# Patient Record
Sex: Male | Born: 1945 | Race: White | Hispanic: No | Marital: Married | Smoking: Former smoker
Health system: Southern US, Community
[De-identification: ages and names within clinical notes are randomized; demographics above are authoritative.]

## PROBLEM LIST (undated history)

## (undated) DIAGNOSIS — H3530 Unspecified macular degeneration: Secondary | ICD-10-CM

## (undated) DIAGNOSIS — M1990 Unspecified osteoarthritis, unspecified site: Secondary | ICD-10-CM

## (undated) DIAGNOSIS — D369 Benign neoplasm, unspecified site: Secondary | ICD-10-CM

## (undated) DIAGNOSIS — K635 Polyp of colon: Secondary | ICD-10-CM

## (undated) DIAGNOSIS — N200 Calculus of kidney: Secondary | ICD-10-CM

## (undated) DIAGNOSIS — K219 Gastro-esophageal reflux disease without esophagitis: Secondary | ICD-10-CM

## (undated) DIAGNOSIS — R5383 Other fatigue: Secondary | ICD-10-CM

## (undated) DIAGNOSIS — Z87442 Personal history of urinary calculi: Secondary | ICD-10-CM

## (undated) DIAGNOSIS — M51369 Other intervertebral disc degeneration, lumbar region without mention of lumbar back pain or lower extremity pain: Secondary | ICD-10-CM

## (undated) DIAGNOSIS — E785 Hyperlipidemia, unspecified: Secondary | ICD-10-CM

## (undated) DIAGNOSIS — I10 Essential (primary) hypertension: Secondary | ICD-10-CM

## (undated) DIAGNOSIS — T884XXA Failed or difficult intubation, initial encounter: Secondary | ICD-10-CM

## (undated) DIAGNOSIS — J309 Allergic rhinitis, unspecified: Secondary | ICD-10-CM

## (undated) DIAGNOSIS — H269 Unspecified cataract: Secondary | ICD-10-CM

## (undated) DIAGNOSIS — F419 Anxiety disorder, unspecified: Secondary | ICD-10-CM

## (undated) DIAGNOSIS — L4050 Arthropathic psoriasis, unspecified: Secondary | ICD-10-CM

## (undated) DIAGNOSIS — R7301 Impaired fasting glucose: Secondary | ICD-10-CM

## (undated) DIAGNOSIS — Z86010 Personal history of colonic polyps: Secondary | ICD-10-CM

## (undated) DIAGNOSIS — J302 Other seasonal allergic rhinitis: Secondary | ICD-10-CM

## (undated) DIAGNOSIS — M259 Joint disorder, unspecified: Secondary | ICD-10-CM

## (undated) DIAGNOSIS — T7840XA Allergy, unspecified, initial encounter: Secondary | ICD-10-CM

## (undated) DIAGNOSIS — J189 Pneumonia, unspecified organism: Secondary | ICD-10-CM

## (undated) DIAGNOSIS — R319 Hematuria, unspecified: Secondary | ICD-10-CM

## (undated) DIAGNOSIS — Z87440 Personal history of urinary (tract) infections: Secondary | ICD-10-CM

## (undated) DIAGNOSIS — R011 Cardiac murmur, unspecified: Secondary | ICD-10-CM

## (undated) HISTORY — DX: Benign neoplasm, unspecified site: D36.9

## (undated) HISTORY — DX: Impaired fasting glucose: R73.01

## (undated) HISTORY — DX: Unspecified cataract: H26.9

## (undated) HISTORY — DX: Joint disorder, unspecified: M25.9

## (undated) HISTORY — DX: Other fatigue: R53.83

## (undated) HISTORY — DX: Gastro-esophageal reflux disease without esophagitis: K21.9

## (undated) HISTORY — DX: Essential (primary) hypertension: I10

## (undated) HISTORY — DX: Other intervertebral disc degeneration, lumbar region without mention of lumbar back pain or lower extremity pain: M51.369

## (undated) HISTORY — DX: Polyp of colon: K63.5

## (undated) HISTORY — DX: Calculus of kidney: N20.0

## (undated) HISTORY — DX: Hematuria, unspecified: R31.9

## (undated) HISTORY — DX: Personal history of colonic polyps: Z86.010

## (undated) HISTORY — DX: Unspecified macular degeneration: H35.30

## (undated) HISTORY — DX: Anxiety disorder, unspecified: F41.9

## (undated) HISTORY — DX: Personal history of urinary (tract) infections: Z87.440

## (undated) HISTORY — DX: Allergy, unspecified, initial encounter: T78.40XA

## (undated) HISTORY — PX: COLONOSCOPY: SHX174

## (undated) HISTORY — DX: Hyperlipidemia, unspecified: E78.5

## (undated) HISTORY — DX: Allergic rhinitis, unspecified: J30.9

## (undated) HISTORY — DX: Arthropathic psoriasis, unspecified: L40.50

## (undated) HISTORY — DX: Unspecified osteoarthritis, unspecified site: M19.90

---

## 1978-01-25 HISTORY — PX: APPENDECTOMY: SHX54

## 2004-04-06 ENCOUNTER — Ambulatory Visit (HOSPITAL_COMMUNITY): Admission: RE | Admit: 2004-04-06 | Discharge: 2004-04-06 | Payer: Self-pay | Admitting: *Deleted

## 2008-09-17 ENCOUNTER — Emergency Department (HOSPITAL_COMMUNITY): Admission: EM | Admit: 2008-09-17 | Discharge: 2008-09-17 | Payer: Self-pay | Admitting: Emergency Medicine

## 2009-12-31 ENCOUNTER — Inpatient Hospital Stay (HOSPITAL_COMMUNITY)
Admission: EM | Admit: 2009-12-31 | Discharge: 2010-01-03 | Payer: Self-pay | Source: Home / Self Care | Attending: Internal Medicine | Admitting: Internal Medicine

## 2010-01-08 ENCOUNTER — Encounter
Admission: RE | Admit: 2010-01-08 | Discharge: 2010-01-08 | Payer: Self-pay | Source: Home / Self Care | Attending: Internal Medicine | Admitting: Internal Medicine

## 2010-03-11 NOTE — Discharge Summary (Signed)
Steven Holloway, Steven Holloway                ACCOUNT NO.:  [id number]  MEDICAL RECORD NO.:  [id number]          PATIENT TYPE:  INP  LOCATION:  5150                         FACILITY:  [facility]  PHYSICIAN:  Larina Earthly, M.D.        DATE OF BIRTH:  [birth year]  DATE OF ADMISSION:  12/31/2009 DATE OF DISCHARGE:  01/03/2010                              DISCHARGE SUMMARY   DISCHARGE DIAGNOSES: 1. Partial small-bowel obstruction, resolved status post placement of     nasogastric tube without operative intervention resolved     radiologically and clinically. 2. Hypertension. 3. Abnormal PSA probably secondary to prostatitis now with normal PSA. 4. 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  DISCHARGE MEDICATIONS:  Losartan 100 mg half a tablets p.o. b.i.d., vitamin B complex over the counter, aspirin 81 mg each day, multivitamin each day, fish oil over the counter 2 tablets daily, cranberry supplement over the counter held while in the hospital.  FINAL LAB RESULTS:  White blood cell count 6.4, hemoglobin 13.1, hematocrit 37.9%, platelet count 149, ionized calcium 1.17, sodium 140, potassium 4.1, BUN 17, creatinine 1.04.  PT/INR 1.05.  PT 13.9 seconds. GFR greater than 60.  Calcium 9.4.  Total protein 6.3, albumin 3.4, AST 22, ALT 20, alkaline phosphatase 76, total bilirubin 1.2, lipase 42. PSA 2.98.  Urinalysis unremarkable for evidence of infection with urine culture revealing no growth.  C. difficile toxin negative.  RADIOLOGY ASSESSMENT:  CT scan of the abdomen, pelvis revealed small bowel obstructive pattern favored to be partial/ low grade obstruction. No discrete transition point is identified.  Adhesions are most likely consideration.  Small amount of ascites related to obstruction. Cholelithiasis, renal cyst, elevated left hemidiaphragm with left basilar atelectasis.  KUB on January 03, 2010, revealed elevated left hemidiaphragm probably due to  gastric distention, otherwise normal chest x-ray.  Cervical spine complete 4-view reveals loss of lordosis and degenerative disk disease.  No acute findings.  Bilateral carotid bifurcation, calcification.  KUB on January 03, 2010, revealed improving small bowel obstructive pattern, slight prominence of upper pelvic, small bowel loops persists.  HISTORY OF PRESENT ILLNESS:  [age]-year-old male seen in November for annual physical exam and prior to that had physical labs to include urinalysis that was positive for urinary tract infection in addition to an elevated PSA, was placed on Cipro.  He subsequently developed symptoms of abdominal discomfort, urinary symptoms, dysuria, fevers and chills.  Because of his GI symptoms correlated with the initiation of Cipro, Cipro was changed to Macrodantin.  He called in early December with increasing abdominal bloating, increasing constipation, gas.  KUB on December 30, 2009, was negative.  He was treated with MiraLax and increased fluids and Prilosec.  He came in to the emergency departmenton December 31, 2009, where radiological evaluation revealed partial small-bowel obstruction.  NG tube was placed.  He was treated with morphine, Zofran and he was subsequently admitted.  HOSPITAL COURSE:  The day after admission NG tube was placed.  Surgical consultation was obtained.  He was afebrile.  Vital signs were stable and there was no white  blood cell count.  Surgical consultation was obtained and given the fact that the patient seems to be resolving, NG tube was clamped and the patient was observed.  On January 02, 2010, NG tube was discontinued.  The patient was placed on clear fluids and diet was advanced as tolerated.  The patient remained hemodynamically stable with normal blood pressure.  Abnormal PSA in the past did resolve with a PSA of 2.98 and is also noted that the patient was getting benzodiazepine for cervical degenerative disk disease  and radiculopathy. Carotid calcifications were noted on plain films with the need for carotid ultrasound on an outpatient basis noted.  On January 03, 2010, the patient tolerated advance diet.  Vital signs were stable.  No further nausea or vomiting and the patient was deemed appropriate for discharge on January 03, 2010, for outpatient followup.     Larina Earthly, M.D.     RA/MEDQ  D:  02/03/2010  T:  02/03/2010  Job:  [id number]  Electronically Signed by Larina Earthly M.D. on 03/11/2010 09:56:45 PM

## 2010-04-07 LAB — URINE CULTURE: Culture  Setup Time: 201112071720

## 2010-04-07 LAB — DIFFERENTIAL
Basophils Absolute: 0.1 10*3/uL (ref 0.0–0.1)
Eosinophils Absolute: 0.6 10*3/uL (ref 0.0–0.7)
Monocytes Relative: 11 % (ref 3–12)
Neutro Abs: 3.5 10*3/uL (ref 1.7–7.7)
Neutrophils Relative %: 57 % (ref 43–77)

## 2010-04-07 LAB — COMPREHENSIVE METABOLIC PANEL
ALT: 26 U/L (ref 0–53)
AST: 22 U/L (ref 0–37)
AST: 25 U/L (ref 0–37)
Albumin: 3.9 g/dL (ref 3.5–5.2)
Alkaline Phosphatase: 76 U/L (ref 39–117)
Alkaline Phosphatase: 81 U/L (ref 39–117)
BUN: 12 mg/dL (ref 6–23)
Calcium: 8.9 mg/dL (ref 8.4–10.5)
Calcium: 9.4 mg/dL (ref 8.4–10.5)
Chloride: 107 mEq/L (ref 96–112)
Creatinine, Ser: 1.08 mg/dL (ref 0.4–1.5)
GFR calc Af Amer: >60 mL/min (ref >60)
GFR calc non Af Amer: >60 mL/min (ref >60)
GFR calc non Af Amer: >60 mL/min (ref >60)
Glucose, Bld: 92 mg/dL (ref 70–99)
Glucose, Bld: 97 mg/dL (ref 70–99)
Potassium: 3.8 mEq/L (ref 3.5–5.1)
Sodium: 146 mEq/L — ABNORMAL HIGH (ref 135–145)

## 2010-04-07 LAB — POCT I-STAT, CHEM 8
BUN: 15 mg/dL (ref 6–23)
Calcium, Ion: 1.17 mmol/L (ref 1.12–1.32)
Hemoglobin: 14.3 g/dL (ref 13.0–17.0)
Potassium: 4 mEq/L (ref 3.5–5.1)

## 2010-04-07 LAB — CBC
HCT: 37.9 % — ABNORMAL LOW (ref 39.0–52.0)
HCT: 39.1 % (ref 39.0–52.0)
HCT: 42.1 % (ref 39.0–52.0)
Hemoglobin: 13 g/dL (ref 13.0–17.0)
Hemoglobin: 13.1 g/dL (ref 13.0–17.0)
Hemoglobin: 14.4 g/dL (ref 13.0–17.0)
MCV: 87.1 fL (ref 78.0–100.0)
MCV: 88.4 fL (ref 78.0–100.0)
Platelets: 139 10*3/uL — ABNORMAL LOW (ref 150–400)
Platelets: 176 10*3/uL (ref 150–400)
RBC: 4.35 MIL/uL (ref 4.22–5.81)
RBC: 4.76 MIL/uL (ref 4.22–5.81)
RDW: 12.6 % (ref 11.5–15.5)
WBC: 6.2 10*3/uL (ref 4.0–10.5)
WBC: 6.4 10*3/uL (ref 4.0–10.5)
WBC: 7 10*3/uL (ref 4.0–10.5)

## 2010-04-07 LAB — BASIC METABOLIC PANEL
CO2: 23 mEq/L (ref 19–32)
GFR calc Af Amer: >60 mL/min (ref >60)
GFR calc non Af Amer: >60 mL/min (ref >60)
Glucose, Bld: 77 mg/dL (ref 70–99)
Potassium: 4.1 mEq/L (ref 3.5–5.1)

## 2010-04-07 LAB — PROTIME-INR
INR: 1.05 (ref 0.00–1.49)
Prothrombin Time: 13.9 seconds (ref 11.6–15.2)

## 2010-04-07 LAB — LIPASE, BLOOD: Lipase: 42 U/L (ref 11–59)

## 2010-04-07 LAB — URINALYSIS, ROUTINE W REFLEX MICROSCOPIC
Bilirubin Urine: NEGATIVE
Ketones, ur: 15 mg/dL — AB
Leukocytes, UA: NEGATIVE
Urobilinogen, UA: 0.2 mg/dL (ref 0.0–1.0)

## 2010-04-07 LAB — LACTIC ACID, PLASMA: Lactic Acid, Venous: 0.9 mmol/L (ref 0.5–2.2)

## 2010-04-07 LAB — CLOSTRIDIUM DIFFICILE BY PCR: Toxigenic C. Difficile by PCR: NEGATIVE

## 2010-04-07 LAB — URINE MICROSCOPIC-ADD ON

## 2013-03-20 ENCOUNTER — Encounter: Payer: Self-pay | Admitting: Internal Medicine

## 2013-05-04 ENCOUNTER — Ambulatory Visit (AMBULATORY_SURGERY_CENTER): Payer: Medicare Other | Admitting: *Deleted

## 2013-05-04 ENCOUNTER — Telehealth: Payer: Self-pay | Admitting: *Deleted

## 2013-05-04 VITALS — Ht 67.5 in | Wt 219.4 lb

## 2013-05-04 DIAGNOSIS — Z1211 Encounter for screening for malignant neoplasm of colon: Secondary | ICD-10-CM

## 2013-05-04 MED ORDER — NA SULFATE-K SULFATE-MG SULF 17.5-3.13-1.6 GM/177ML PO SOLN: 1.0000 | Freq: Once | ORAL | Status: DC

## 2013-05-04 NOTE — Progress Notes (Signed)
No egg or soy allergy. No anesthesia problems.  

## 2013-05-04 NOTE — Telephone Encounter (Signed)
Pt had pre-visit 05/04/13 for colonoscopy scheduled on 05/18/13 at 11am, pt has previous colonoscopy in 2006 at [facility] by Dr. Knox Saliva, I cannot see report or find the results of this procedure, pt states he did not find anything, no polyps, pt is not currently having any bowel issues, pt would not be due for screening until 2016, is it ok to go forward with scheduled colonoscopy or does pt need to wait until next year? pls adv-adm

## 2013-05-07 ENCOUNTER — Encounter: Payer: Self-pay | Admitting: Internal Medicine

## 2013-05-07 NOTE — Telephone Encounter (Signed)
Dr. Carlean Purl, Pamala Hurry checked this pt's last procedure for me- he didn't have any polyps with his last procedure in 2006 and is recommended to have his next one in 86 years  Steven Holloway

## 2013-05-07 NOTE — Telephone Encounter (Signed)
Please check Cori for colonoscopy

## 2013-05-07 NOTE — Telephone Encounter (Signed)
Report (colonoscopy 04/06/2004) has been put on Dr Celesta Aver desk for review.

## 2013-05-07 NOTE — Telephone Encounter (Signed)
It is in [place] , dated 04/06/2004.  We can show you the report tomorrow in [place], unable to print , printer broke.  Said repeat in 10 years, no polyps.

## 2013-05-10 ENCOUNTER — Encounter: Payer: Self-pay | Admitting: Internal Medicine

## 2013-05-10 NOTE — Progress Notes (Signed)
Patient ID: Steven Holloway, male   DOB: [birth year], 68 y.o.   MRN: [id number] Faxed copy of 04/06/2004 colonoscopy report to Dr. Dagmar Hait per Dr. Carlean Purl.

## 2013-05-10 NOTE — Telephone Encounter (Signed)
As long as we are sure no other reasons like heme + stool or anemia then can wait until 03/2014. Let me know

## 2013-05-10 NOTE — Telephone Encounter (Signed)
Above noted and pt's wife notified.  Recall put into computer and instructions made for pt to use Suprep since he has that already  Spoke with pt's wife and she checked expiration on pt's box- expires 12-2014

## 2013-05-18 ENCOUNTER — Encounter: Payer: Self-pay | Admitting: Internal Medicine

## 2014-03-21 ENCOUNTER — Encounter: Payer: Self-pay | Admitting: Internal Medicine

## 2014-05-14 ENCOUNTER — Ambulatory Visit (AMBULATORY_SURGERY_CENTER): Payer: Self-pay | Admitting: *Deleted

## 2014-05-14 VITALS — Ht 68.0 in | Wt 225.2 lb

## 2014-05-14 DIAGNOSIS — Z1211 Encounter for screening for malignant neoplasm of colon: Secondary | ICD-10-CM

## 2014-05-14 NOTE — Progress Notes (Signed)
No egg or soy allergy  No anesthesia or intubation problems per pt  No diet medications taken  Registered in [place] instructions given- pt had Suprep from last year.  See telephone encounter from 05-09-13

## 2014-05-27 DIAGNOSIS — Z860101 Personal history of adenomatous and serrated colon polyps: Secondary | ICD-10-CM | POA: Insufficient documentation

## 2014-05-27 DIAGNOSIS — Z86010 Personal history of colonic polyps: Secondary | ICD-10-CM

## 2014-05-27 HISTORY — DX: Personal history of colonic polyps: Z86.010

## 2014-05-27 HISTORY — DX: Personal history of adenomatous and serrated colon polyps: Z86.0101

## 2014-05-28 ENCOUNTER — Encounter: Payer: Self-pay | Admitting: Internal Medicine

## 2014-05-28 ENCOUNTER — Ambulatory Visit (AMBULATORY_SURGERY_CENTER): Payer: Medicare Other | Admitting: Internal Medicine

## 2014-05-28 VITALS — BP 105/60 | HR 52 | Temp 95.9°F | Resp 17 | Ht 68.0 in | Wt 225.0 lb

## 2014-05-28 DIAGNOSIS — D120 Benign neoplasm of cecum: Secondary | ICD-10-CM | POA: Diagnosis not present

## 2014-05-28 DIAGNOSIS — Z1211 Encounter for screening for malignant neoplasm of colon: Secondary | ICD-10-CM | POA: Diagnosis not present

## 2014-05-28 MED ORDER — SODIUM CHLORIDE 0.9 % IV SOLN: 500.0000 mL | INTRAVENOUS | Status: DC

## 2014-05-28 NOTE — Op Note (Signed)
[facility]  [address] [place], [address]   COLONOSCOPY PROCEDURE REPORT  PATIENT: Steven Holloway, Steven Holloway  MR#: [id number] BIRTHDATE: Mar 28, 1945 , 7  yrs. old GENDER: male ENDOSCOPIST: Gatha Mayer, MD, [facility] PROCEDURE DATE:  05/28/2014 PROCEDURE:   Colonoscopy, screening, Colonoscopy with biopsy, and Colonoscopy with snare polypectomy First Screening Colonoscopy - Avg.  risk and is 50 yrs.  old or older - No.  Prior Negative Screening - Now for repeat screening. 10 or more years since last screening  History of Adenoma - Now for follow-up colonoscopy & has been > or = to 3 yrs.  N/A ASA CLASS:   Class II INDICATIONS:Screening for colonic neoplasia and Colorectal Neoplasm Risk Assessment for this procedure is average risk. MEDICATIONS: Propofol 250 mg IV and Monitored anesthesia care  DESCRIPTION OF PROCEDURE:   After the risks benefits and alternatives of the procedure were thoroughly explained, informed consent was obtained.  The digital rectal exam revealed no abnormalities of the rectum, revealed no prostatic nodules, and revealed the prostate was not enlarged.   The LB [id number] endoscope was introduced through the anus and advanced to the cecum, which was identified by both the appendix and ileocecal valve. No adverse events experienced.   The quality of the prep was excellent.  (MiraLax was used)  The instrument was then slowly withdrawn as the colon was fully examined.      COLON FINDINGS: Two sessile polyps ranging from 2 to 78mm in size were found at the cecum.  Polypectomies were performed with cold forceps (43mm) and with a cold snare (10 mm).  The resection was complete, the polyp tissue was completely retrieved and sent to histology.   Internal hemorrhoids were found.   The examination was otherwise normal.  Retroflexed views revealed internal hemorrhoids. The time to cecum = 2.4 Withdrawal time = 13.3   The scope was withdrawn and the  procedure completed. COMPLICATIONS: There were no immediate complications.  ENDOSCOPIC IMPRESSION: 1.   Two sessile polyps ranging from 2 to 21mm in size were found at the cecum; polypectomies were performed with cold forceps and with a cold snare 2.   Internal hemorrhoids 3.   The examination was otherwise normal - excellent prep - prior negative screening  RECOMMENDATIONS: Timing of repeat colonoscopy will be determined by pathology findings.  eSigned:  Gatha Mayer, MD, [facility] 05/28/2014 8:58 AM   cc: Berneta Sages, MD and The Patient

## 2014-05-28 NOTE — Patient Instructions (Addendum)
I found and removed 2 polyps from the colon. They look benign.  I will let you know pathology results and when to have another routine colonoscopy by mail.  I appreciate the opportunity to care for you. Gatha Mayer, MD, [facility]    Handouts were given to your care partner on polyps and hemorrhoids. You may resume your current medications today. Await biopsy results. Please call if any questions or concerns.     YOU HAD AN ENDOSCOPIC PROCEDURE TODAY AT [facility]:   Refer to the procedure report that was given to you for any specific questions about what was found during the examination.  If the procedure report does not answer your questions, please call your gastroenterologist to clarify.  If you requested that your care partner not be given the details of your procedure findings, then the procedure report has been included in a sealed envelope for you to review at your convenience later.  YOU SHOULD EXPECT: Some feelings of bloating in the abdomen. Passage of more gas than usual.  Walking can help get rid of the air that was put into your GI tract during the procedure and reduce the bloating. If you had a lower endoscopy (such as a colonoscopy or flexible sigmoidoscopy) you may notice spotting of blood in your stool or on the toilet paper. If you underwent a bowel prep for your procedure, you may not have a normal bowel movement for a few days.  Please Note:  You might notice some irritation and congestion in your nose or some drainage.  This is from the oxygen used during your procedure.  There is no need for concern and it should clear up in a day or so.  SYMPTOMS TO REPORT IMMEDIATELY:   Following lower endoscopy (colonoscopy or flexible sigmoidoscopy):  Excessive amounts of blood in the stool  Significant tenderness or worsening of abdominal pains  Swelling of the abdomen that is new, acute  Fever of 100F or higher   For urgent or emergent issues, a  gastroenterologist can be reached at any hour by calling [phone].   DIET: Your first meal following the procedure should be a small meal and then it is ok to progress to your normal diet. Heavy or fried foods are harder to digest and may make you feel nauseous or bloated.  Likewise, meals heavy in dairy and vegetables can increase bloating.  Drink plenty of fluids but you should avoid alcoholic beverages for 24 hours.  ACTIVITY:  You should plan to take it easy for the rest of today and you should NOT DRIVE or use heavy machinery until tomorrow (because of the sedation medicines used during the test).    FOLLOW UP: Our staff will call the number listed on your records the next business day following your procedure to check on you and address any questions or concerns that you may have regarding the information given to you following your procedure. If we do not reach you, we will leave a message.  However, if you are feeling well and you are not experiencing any problems, there is no need to return our call.  We will assume that you have returned to your regular daily activities without incident.  If any biopsies were taken you will be contacted by phone or by letter within the next 1-3 weeks.  Please call us at [phone] if you have not heard about the biopsies in 3 weeks.    SIGNATURES/CONFIDENTIALITY: You and/or  your care partner have signed paperwork which will be entered into your electronic medical record.  These signatures attest to the fact that that the information above on your After Visit Summary has been reviewed and is understood.  Full responsibility of the confidentiality of this discharge information lies with you and/or your care-partner.

## 2014-05-28 NOTE — Progress Notes (Signed)
Called to room to assist during endoscopic procedure.  Patient ID and intended procedure confirmed with present staff. Received instructions for my participation in the procedure from the performing physician.  

## 2014-05-28 NOTE — Progress Notes (Signed)
A/ox3 pleased with MAC, report to Annette RN 

## 2014-05-28 NOTE — Progress Notes (Signed)
No problems noted in the recovery room. maw 

## 2014-05-29 ENCOUNTER — Telehealth: Payer: Self-pay | Admitting: *Deleted

## 2014-05-29 NOTE — Telephone Encounter (Signed)
  Follow up Call-  Call back number 05/28/2014  Post procedure Call Back phone  # [phone]  Permission to leave phone message Yes     Patient questions:  Do you have a fever, pain , or abdominal swelling? No. Pain Score  0 *  Have you tolerated food without any problems? Yes.    Have you been able to return to your normal activities? Yes.    Do you have any questions about your discharge instructions: Diet   No. Medications  No. Follow up visit  No.  Do you have questions or concerns about your Care? No.  Actions: * If pain score is 4 or above: No action needed, pain <4.

## 2014-06-04 ENCOUNTER — Encounter: Payer: Self-pay | Admitting: Internal Medicine

## 2014-06-04 DIAGNOSIS — Z86010 Personal history of colonic polyps: Secondary | ICD-10-CM

## 2014-06-04 NOTE — Progress Notes (Signed)
Quick Note:  2 adenomas max 10 mm Repeat colonoscopy 2019 ______

## 2014-06-28 NOTE — Addendum Note (Signed)
Addended by: Steva Ready on: 06/28/2014 11:26 AM   Modules accepted: Level of Service

## 2015-06-27 ENCOUNTER — Other Ambulatory Visit: Payer: Self-pay | Admitting: Internal Medicine

## 2015-06-27 DIAGNOSIS — I739 Peripheral vascular disease, unspecified: Principal | ICD-10-CM

## 2015-06-27 DIAGNOSIS — I779 Disorder of arteries and arterioles, unspecified: Secondary | ICD-10-CM

## 2015-07-02 ENCOUNTER — Ambulatory Visit
Admission: RE | Admit: 2015-07-02 | Discharge: 2015-07-02 | Disposition: A | Discharge status: Home / Self Care | Payer: Medicare Other | Source: Ambulatory Visit | Attending: Internal Medicine | Admitting: Internal Medicine

## 2015-07-02 DIAGNOSIS — I779 Disorder of arteries and arterioles, unspecified: Secondary | ICD-10-CM

## 2015-07-02 DIAGNOSIS — I739 Peripheral vascular disease, unspecified: Principal | ICD-10-CM

## 2015-09-17 ENCOUNTER — Telehealth: Payer: Self-pay | Admitting: Radiation Oncology

## 2015-09-17 NOTE — Telephone Encounter (Signed)
09/24/2015 Appointments canceled per patient request. Mistake made with referral. Patient did not need to see one of the [facility] physicians. Nurse made mistake of believing the patient needed to see Dr. Tyler Pita and the doctor needed was Dr. Tresa Moore. Again patient does not need to see [facility] physician.

## 2015-09-23 ENCOUNTER — Encounter: Payer: Self-pay | Admitting: Radiation Oncology

## 2015-09-24 ENCOUNTER — Ambulatory Visit: Admission: RE | Admit: 2015-09-24 | Payer: Medicare Other | Source: Ambulatory Visit | Admitting: Radiation Oncology

## 2015-09-24 ENCOUNTER — Ambulatory Visit: Payer: Medicare Other | Admitting: Radiation Oncology

## 2015-09-24 ENCOUNTER — Ambulatory Visit: Payer: Medicare Other

## 2015-12-19 ENCOUNTER — Encounter (HOSPITAL_COMMUNITY): Payer: Self-pay | Admitting: *Deleted

## 2015-12-19 ENCOUNTER — Ambulatory Visit (HOSPITAL_COMMUNITY)
Admission: EM | Admit: 2015-12-19 | Discharge: 2015-12-19 | Disposition: A | Discharge status: Home / Self Care | Payer: Medicare Other | Attending: Internal Medicine | Admitting: Internal Medicine

## 2015-12-19 DIAGNOSIS — R509 Fever, unspecified: Secondary | ICD-10-CM | POA: Diagnosis not present

## 2015-12-19 DIAGNOSIS — N390 Urinary tract infection, site not specified: Secondary | ICD-10-CM | POA: Diagnosis not present

## 2015-12-19 LAB — POCT URINALYSIS DIP (DEVICE)
Glucose, UA: NEGATIVE mg/dL
Nitrite: NEGATIVE
PH: 6 (ref 5.0–8.0)
PROTEIN: 100 mg/dL — AB
SPECIFIC GRAVITY, URINE: 1.025 (ref 1.005–1.030)
UROBILINOGEN UA: 1 mg/dL (ref 0.0–1.0)

## 2015-12-19 MED ORDER — CIPROFLOXACIN HCL 500 MG PO TABS: 500.0000 mg | ORAL_TABLET | Freq: Two times a day (BID) | ORAL | 0 refills | Status: DC

## 2015-12-19 NOTE — ED Notes (Signed)
Patient provided urine specimen while in lobby, specimen in lab

## 2015-12-19 NOTE — ED Provider Notes (Signed)
[place]    CSN: [id number] Arrival date & time: 12/19/15  1343     History   Chief Complaint Chief Complaint  Patient presents with  . Fever    HPI Steven Holloway is a 70 y.o. male. Presents today with recent history of prostate biopsy on [place]. Hasn't really felt well since, had some vomiting week before last. Urinary frequency starting the night before last, every 30 minutes. Small volumes. Urinary discomfort. Yesterday had emesis 3, abdomen is sore today from vomiting. No diarrhea, has not had a bowel movement yet. Temperature to 102 yesterday. Lips feel little dry. Similar symptoms in the past with urinary tract infection. Also has some dry cough, prone to bronchitis. Worried about infection in biopsy site.  HPI  Past Medical History:  Diagnosis Date  . Allergy    sulfa and seasonal   . Arthritis   . Colon cancer ([place])   . Hx of adenomatous colonic polyps 05/27/2014  . Hypertension     Patient Active Problem List   Diagnosis Date Noted  . Hx of adenomatous colonic polyps 05/27/2014    Past Surgical History:  Procedure Laterality Date  . APPENDECTOMY  1980  . COLONOSCOPY         Home Medications    Prior to Admission medications   Medication Sig Start Date End Date Taking? Authorizing Provider  Pravastatin Sodium (PRAVACHOL PO) Take by mouth.   Yes Historical Provider, MD  aspirin EC 81 MG tablet Take 81 mg by mouth daily.    Historical Provider, MD  ciprofloxacin (CIPRO) 500 MG tablet Take 1 tablet (500 mg total) by mouth 2 (two) times daily. 12/19/15   Sherlene Shams, MD  Cranberry (SM CRANBERRY) 300 MG tablet Take 300 mg by mouth daily.    Historical Provider, MD  loratadine (CLARITIN) 10 MG tablet Take 10 mg by mouth daily.    Historical Provider, MD  losartan-hydrochlorothiazide (HYZAAR) 100-25 MG per tablet Take 0.5 tablets by mouth 2 (two) times daily.    Historical Provider, MD  Na Sulfate-K Sulfate-Mg Sulf (SUPREP BOWEL PREP) SOLN  Take 1 kit by mouth once. Name brand only, suprep as directed, no substitutions 05/04/13   Gatha Mayer, MD  Omega-3 Fatty Acids (FISH OIL) 1000 MG CAPS Take 1,000 mg by mouth daily.    Historical Provider, MD  OVER THE COUNTER MEDICATION OTC stool softener daily    Historical Provider, MD  traMADol (ULTRAM) 50 MG tablet Take 50 mg by mouth every 6 (six) hours as needed.    Historical Provider, MD    Family History Family History  Problem Relation Age of Onset  . Colon cancer Neg Hx   . Esophageal cancer Neg Hx   . Rectal cancer Neg Hx   . Stomach cancer Neg Hx     Social History Social History  Substance Use Topics  . Smoking status: Former Research scientist (life sciences)  . Smokeless tobacco: Never Used  . Alcohol use Yes     Comment: ocaasional beer     Allergies   Sulfa antibiotics   Review of Systems Review of Systems  All other systems reviewed and are negative.    Physical Exam Triage Vital Signs ED Triage Vitals [12/19/15 1447]  Enc Vitals Group     BP 121/65     Pulse Rate 82     Resp 18     Temp 99.2 F (37.3 C)     Temp Source Oral  SpO2 95 %     Weight      Height      Pain Score    Updated Vital Signs BP 121/65   Pulse 82   Temp 99.2 F (37.3 C) (Oral)   Resp 18   SpO2 95%  Physical Exam  Constitutional: He is oriented to person, place, and time. No distress.  Alert, nicely groomed Coughs several times during the exam  HENT:  Head: Atraumatic.  Eyes:  Conjugate gaze, no eye redness/drainage  Neck: Neck supple.  Cardiovascular: Normal rate and regular rhythm.   Pulmonary/Chest: No respiratory distress. He has no wheezes. He has no rales.  Coarse but symmetric breath sounds throughout  Abdominal: He exhibits no distension.  Musculoskeletal: Normal range of motion.  Neurological: He is alert and oriented to person, place, and time.  Skin: Skin is warm and dry.  No cyanosis  Nursing note and vitals reviewed.    UC Treatments / Results   Labs  Results for orders placed or performed during the hospital encounter of 12/19/15  POCT urinalysis dip (device)  Result Value Ref Range   Glucose, UA NEGATIVE NEGATIVE mg/dL   Bilirubin Urine SMALL (A) NEGATIVE   Ketones, ur TRACE (A) NEGATIVE mg/dL   Specific Gravity, Urine 1.025 1.005 - 1.030   Hgb urine dipstick LARGE (A) NEGATIVE   pH 6.0 5.0 - 8.0   Protein, ur 100 (A) NEGATIVE mg/dL   Urobilinogen, UA 1.0 0.0 - 1.0 mg/dL   Nitrite NEGATIVE NEGATIVE   Leukocytes, UA SMALL (A) NEGATIVE    Procedures Procedures (including critical care time)  Final Clinical Impressions(s) / UC Diagnoses   Final diagnoses:  Acute urinary tract infection  Febrile illness   Urine studies today suggest urinary tract infection; urine culture is pending.  Prescription for cipro (antibiotic) was sent to the [facility] on [address].  Followup with urologist Dr Tresa Moore to discuss recurrent urinary symptoms.    New Prescriptions New Prescriptions   CIPROFLOXACIN (CIPRO) 500 MG TABLET    Take 1 tablet (500 mg total) by mouth 2 (two) times daily.     Sherlene Shams, MD 12/19/15 [phone]

## 2015-12-19 NOTE — ED Triage Notes (Signed)
Fever   Chills   Slight  Burning  On  Urination          History  Of  uti        Had  Biopsy     About  7  Weeks  Ago

## 2015-12-19 NOTE — Discharge Instructions (Addendum)
Urine studies today suggest urinary tract infection; urine culture is pending.  Prescription for cipro (antibiotic) was sent to the [facility] on [address].  Followup with urologist to discuss recurrent urinary symptoms.

## 2015-12-22 ENCOUNTER — Telehealth (HOSPITAL_COMMUNITY): Payer: Self-pay | Admitting: Internal Medicine

## 2015-12-22 LAB — URINE CULTURE: Culture: 60000 — AB

## 2015-12-22 MED ORDER — CEPHALEXIN 500 MG PO CAPS: 500.0000 mg | ORAL_CAPSULE | Freq: Two times a day (BID) | ORAL | 0 refills | Status: DC

## 2015-12-22 NOTE — ED Notes (Signed)
12/22/15 at [facility], PT's wife calls. Pharmacy has informed her that Keflex is ready for pickup. Per Dr. Charlton Amor note, PT instructed to start Keflex and to take as prescribed. PT instructed to stop taking cipro. PT and wife acknowledge instructions.

## 2015-12-22 NOTE — Telephone Encounter (Signed)
Please let patient know that urine culture is positive for Strep and for E coli, resistant to cipro rx given at recent urgent care visit.  E coli is sensitive to cephalexin.  Stop cipro and start cephalexin prescription.  Cephalexin rx sent to pharmacy of record, [facility] on [address].  Followup with urologist or primary care provider for further evaluation if symptoms persist.  LM

## 2015-12-23 ENCOUNTER — Telehealth (HOSPITAL_COMMUNITY): Payer: Self-pay | Admitting: Emergency Medicine

## 2015-12-23 NOTE — Telephone Encounter (Signed)
Pt called back... notified of recent lab results Pt ID'd properly... Reports feeling better and sx have subsided States Keflex upsets his stomach but states he will try it  Adv him to take it w/food and to start taking probiotics/yogurt Dr. Bridgett Larsson was willing to change Rx to Omnicef 300 mg BID x10 days but pt said it was ok and he will take Keflex.  Adv pt if sx are not getting better to return or to f/u w/PCP Pt verb understanding.

## 2015-12-23 NOTE — Telephone Encounter (Signed)
LM on [phone] Need to give lab results and to see how pt is doing from recent visit on 11/24 Notified of pending Rx sent to pharmacy

## 2015-12-23 NOTE — Telephone Encounter (Signed)
-----   Message from Sherlene Shams, MD sent at 12/22/2015 12:59 PM EST ----- Clinical staff, please let patient know that urine culture is positive for Strep and for E coli, resistant to cipro rx given at recent urgent care visit.   The E coli germ is sensitive to cephalexin.   Stop cipro and start cephalexin prescription.  Cephalexin rx sent to pharmacy of record, [facility] on [address].   Followup with urologist or primary care provider for further evaluation if symptoms persist.  LM

## 2016-02-15 ENCOUNTER — Ambulatory Visit (HOSPITAL_COMMUNITY)
Admission: EM | Admit: 2016-02-15 | Discharge: 2016-02-15 | Disposition: A | Discharge status: Home / Self Care | Payer: Medicare Other | Attending: Emergency Medicine | Admitting: Emergency Medicine

## 2016-02-15 ENCOUNTER — Encounter (HOSPITAL_COMMUNITY): Payer: Self-pay | Admitting: *Deleted

## 2016-02-15 DIAGNOSIS — J40 Bronchitis, not specified as acute or chronic: Secondary | ICD-10-CM

## 2016-02-15 MED ORDER — PREDNISONE 50 MG PO TABS: ORAL_TABLET | ORAL | 0 refills | Status: DC

## 2016-02-15 MED ORDER — BENZONATATE 100 MG PO CAPS: 100.0000 mg | ORAL_CAPSULE | Freq: Three times a day (TID) | ORAL | 0 refills | Status: DC

## 2016-02-15 MED ORDER — AZITHROMYCIN 250 MG PO TABS: ORAL_TABLET | ORAL | 0 refills | Status: DC

## 2016-02-15 NOTE — ED Provider Notes (Signed)
[place]    CSN: [id number] Arrival date & time: 02/15/16  1302     History   Chief Complaint Chief Complaint  Patient presents with  . Cough  . Nasal Congestion    HPI Steven Holloway is a 71 y.o. male.   HPI He is a 71 year old man here for evaluation of cough. His symptoms started about 5 days ago with fever, congestion, cough, and sore throat. He states the fever seems to have resolved as of yesterday. He continues to have significant cough and congestion in the chest. He also reports continued nasal congestion, but states this has been ongoing for 3 years. He took some leftover Cipro that he had for a urinary tract infection which he thinks did help.  Past Medical History:  Diagnosis Date  . Allergy    sulfa and seasonal   . Arthritis   . Colon cancer ([place])   . Hx of adenomatous colonic polyps 05/27/2014  . Hypertension     Patient Active Problem List   Diagnosis Date Noted  . Hx of adenomatous colonic polyps 05/27/2014    Past Surgical History:  Procedure Laterality Date  . APPENDECTOMY  1980  . COLONOSCOPY         Home Medications    Prior to Admission medications   Medication Sig Start Date End Date Taking? Authorizing Provider  aspirin EC 81 MG tablet Take 81 mg by mouth daily.   Yes Historical Provider, MD  Cranberry (SM CRANBERRY) 300 MG tablet Take 300 mg by mouth daily.   Yes Historical Provider, MD  loratadine (CLARITIN) 10 MG tablet Take 10 mg by mouth daily.   Yes Historical Provider, MD  losartan-hydrochlorothiazide (HYZAAR) 100-25 MG per tablet Take 0.5 tablets by mouth 2 (two) times daily.   Yes Historical Provider, MD  Omega-3 Fatty Acids (FISH OIL) 1000 MG CAPS Take 1,000 mg by mouth daily.   Yes Historical Provider, MD  OVER THE COUNTER MEDICATION OTC stool softener daily   Yes Historical Provider, MD  Pravastatin Sodium (PRAVACHOL PO) Take by mouth.   Yes Historical Provider, MD  azithromycin (ZITHROMAX Z-PAK) 250 MG tablet  Take 2 pills today, then 1 pill daily until gone. 02/15/16   Melony Overly, MD  benzonatate (TESSALON) 100 MG capsule Take 1 capsule (100 mg total) by mouth every 8 (eight) hours. 02/15/16   Melony Overly, MD  predniSONE (DELTASONE) 50 MG tablet Take 1 pill daily for 5 days. 02/15/16   Melony Overly, MD    Family History Family History  Problem Relation Age of Onset  . Colon cancer Neg Hx   . Esophageal cancer Neg Hx   . Rectal cancer Neg Hx   . Stomach cancer Neg Hx     Social History Social History  Substance Use Topics  . Smoking status: Former Research scientist (life sciences)  . Smokeless tobacco: Never Used  . Alcohol use Yes     Comment: occasional     Allergies   Sulfa antibiotics   Review of Systems Review of Systems As in history of present illness  Physical Exam Triage Vital Signs ED Triage Vitals  Enc Vitals Group     BP 02/15/16 1539 135/83     Pulse Rate 02/15/16 1539 65     Resp 02/15/16 1539 18     Temp 02/15/16 1539 98.3 F (36.8 C)     Temp Source 02/15/16 1539 Oral     SpO2 02/15/16 1539 98 %  Weight --      Height --      Head Circumference --      Peak Flow --      Pain Score 02/15/16 1542 0     Pain Loc --      Pain Edu? --      Excl. in [place]? --    No data found.   Updated Vital Signs BP 135/83   Pulse 65   Temp 98.3 F (36.8 C) (Oral)   Resp 18   SpO2 98%   Visual Acuity Right Eye Distance:   Left Eye Distance:   Bilateral Distance:    Right Eye Near:   Left Eye Near:    Bilateral Near:     Physical Exam  Constitutional: He is oriented to person, place, and time. He appears well-developed and well-nourished. No distress.  HENT:  Mouth/Throat: Oropharynx is clear and moist. No oropharyngeal exudate.  Nasal mucosa is edematous  Neck: Neck supple.  Cardiovascular: Normal rate, regular rhythm and normal heart sounds.   No murmur heard. Pulmonary/Chest: Effort normal and breath sounds normal. No respiratory distress. He has no wheezes. He has no  rales.  Lymphadenopathy:    He has no cervical adenopathy.  Neurological: He is alert and oriented to person, place, and time.     UC Treatments / Results  Labs (all labs ordered are listed, but only abnormal results are displayed) Labs Reviewed - No data to display  EKG  EKG Interpretation None       Radiology No results found.  Procedures Procedures (including critical care time)  Medications Ordered in UC Medications - No data to display   Initial Impression / Assessment and Plan / UC Course  I have reviewed the triage vital signs and the nursing notes.  Pertinent labs & imaging results that were available during my care of the patient were reviewed by me and considered in my medical decision making (see chart for details).     Given resolution of fevers, we can likely treat with prednisone alone. Tessalon as needed for cough. Prescription provided for azithromycin to fill if not improving in 2 days.  Final Clinical Impressions(s) / UC Diagnoses   Final diagnoses:  Bronchitis    New Prescriptions New Prescriptions   AZITHROMYCIN (ZITHROMAX Z-PAK) 250 MG TABLET    Take 2 pills today, then 1 pill daily until gone.   BENZONATATE (TESSALON) 100 MG CAPSULE    Take 1 capsule (100 mg total) by mouth every 8 (eight) hours.   PREDNISONE (DELTASONE) 50 MG TABLET    Take 1 pill daily for 5 days.     Melony Overly, MD 02/15/16 [phone]

## 2016-02-15 NOTE — ED Triage Notes (Signed)
Reports taking some left-over Cipro to help with sxs, states believes it is helping.

## 2016-02-15 NOTE — ED Triage Notes (Signed)
Started approx 4 days ago with productive cough, chest congestion, head congestion, fevers.  States feels like his "yearly bronchitis".  Has been taking Tylenol (none today).

## 2016-02-15 NOTE — Discharge Instructions (Signed)
It sounds like you are developing bronchitis. Since your fevers are resolving, we may not need antibiotics. Take prednisone daily for the next 5 days. Use Tessalon 3 times a day as needed for cough. Continue your nasal spray and allergy pill. If things are not improving in 2 days, fill the prescription for azithromycin. Follow-up as needed.

## 2016-04-02 ENCOUNTER — Other Ambulatory Visit: Payer: Self-pay | Admitting: Neurological Surgery

## 2016-04-14 NOTE — Pre-Procedure Instructions (Signed)
Steven Holloway  04/14/2016      [place] ([place]), [place] [address]  ([place]) [place] [address] Phone: [phone] Fax: [phone]    Your procedure is scheduled on   Thursday  04/22/16  Report to [facility] Admitting at 745 A.M.  Call this number if you have problems the morning of surgery:  [phone]   Remember:  Do not eat food or drink liquids after midnight.  Take these medicines the morning of surgery with A SIP OF WATER  NONE         (STOP 7 DAYS PRIOR TO SURGERY- ASPIRIN OR ASPIRIN PRODUCTS, IBUPROFEN/ ADVIL/ MOTRIN, GOODY POWDERS, BC'S, MULTIVITAMIN, OMEGA3 FISH OIL, HERBAL MEDICINES)   Do not wear jewelry, make-up or nail polish.  Do not wear lotions, powders, or perfumes, or deoderant.  Do not shave 48 hours prior to surgery.  Men may shave face and neck.  Do not bring valuables to the hospital.  [facility] is not responsible for any belongings or valuables.  Contacts, dentures or bridgework may not be worn into surgery.  Leave your suitcase in the car.  After surgery it may be brought to your room.  For patients admitted to the hospital, discharge time will be determined by your treatment team.  Patients discharged the day of surgery will not be allowed to drive home.   Name and phone number of your driver:    Special instructions:  [place] - Preparing for Surgery  Before surgery, you can play an important role.  Because skin is not sterile, your skin needs to be as free of germs as possible.  You can reduce the number of germs on you skin by washing with CHG (chlorahexidine gluconate) soap before surgery.  CHG is an antiseptic cleaner which kills germs and bonds with the skin to continue killing germs even after washing.  Please DO NOT use if you have an allergy to CHG or antibacterial soaps.  If your skin becomes reddened/irritated stop using the CHG and inform your nurse when you arrive at  Short Stay.  Do not shave (including legs and underarms) for at least 48 hours prior to the first CHG shower.  You may shave your face.  Please follow these instructions carefully:   1.  Shower with CHG Soap the night before surgery and the                                morning of Surgery.  2.  If you choose to wash your hair, wash your hair first as usual with your       normal shampoo.  3.  After you shampoo, rinse your hair and body thoroughly to remove the                      Shampoo.  4.  Use CHG as you would any other liquid soap.  You can apply chg directly       to the skin and wash gently with scrungie or a clean washcloth.  5.  Apply the CHG Soap to your body ONLY FROM THE NECK DOWN.        Do not use on open wounds or open sores.  Avoid contact with your eyes,       ears, mouth and genitals (private parts).  Wash genitals (private  parts)       with your normal soap.  6.  Wash thoroughly, paying special attention to the area where your surgery        will be performed.  7.  Thoroughly rinse your body with warm water from the neck down.  8.  DO NOT shower/wash with your normal soap after using and rinsing off       the CHG Soap.  9.  Pat yourself dry with a clean towel.            10.  Wear clean pajamas.            11.  Place clean sheets on your bed the night of your first shower and do not        sleep with pets.  Day of Surgery  Do not apply any lotions/deoderants the morning of surgery.  Please wear clean clothes to the hospital/surgery center.    Please read over the following fact sheets that you were given. MRSA Information and Surgical Site Infection Prevention

## 2016-04-15 ENCOUNTER — Encounter (HOSPITAL_COMMUNITY)
Admission: RE | Admit: 2016-04-15 | Discharge: 2016-04-15 | Disposition: A | Discharge status: Home / Self Care | Payer: Medicare Other | Source: Ambulatory Visit | Attending: Neurological Surgery | Admitting: Neurological Surgery

## 2016-04-15 ENCOUNTER — Encounter (HOSPITAL_COMMUNITY): Payer: Self-pay

## 2016-04-15 DIAGNOSIS — Z01812 Encounter for preprocedural laboratory examination: Secondary | ICD-10-CM | POA: Diagnosis not present

## 2016-04-15 DIAGNOSIS — R9431 Abnormal electrocardiogram [ECG] [EKG]: Secondary | ICD-10-CM | POA: Diagnosis not present

## 2016-04-15 DIAGNOSIS — M48061 Spinal stenosis, lumbar region without neurogenic claudication: Secondary | ICD-10-CM | POA: Insufficient documentation

## 2016-04-15 DIAGNOSIS — Z01818 Encounter for other preprocedural examination: Secondary | ICD-10-CM | POA: Insufficient documentation

## 2016-04-15 HISTORY — DX: Other seasonal allergic rhinitis: J30.2

## 2016-04-15 LAB — BASIC METABOLIC PANEL
Anion gap: 9 (ref 5–15)
BUN: 13 mg/dL (ref 6–20)
CHLORIDE: 105 mmol/L (ref 101–111)
CO2: 27 mmol/L (ref 22–32)
Calcium: 9.5 mg/dL (ref 8.9–10.3)
Creatinine, Ser: 1.23 mg/dL (ref 0.61–1.24)
GFR calc Af Amer: >60 mL/min (ref >60)
GFR, EST NON AFRICAN AMERICAN: 57 mL/min — AB (ref >60)
GLUCOSE: 103 mg/dL — AB (ref 65–99)
POTASSIUM: 3.4 mmol/L — AB (ref 3.5–5.1)
Sodium: 141 mmol/L (ref 135–145)

## 2016-04-15 LAB — CBC
HEMATOCRIT: 45.1 % (ref 39.0–52.0)
Hemoglobin: 15.5 g/dL (ref 13.0–17.0)
MCH: 29.6 pg (ref 26.0–34.0)
MCHC: 34.4 g/dL (ref 30.0–36.0)
MCV: 86.2 fL (ref 78.0–100.0)
PLATELETS: 158 10*3/uL (ref 150–400)
RBC: 5.23 MIL/uL (ref 4.22–5.81)
RDW: 14.2 % (ref 11.5–15.5)
WBC: 5.8 10*3/uL (ref 4.0–10.5)

## 2016-04-15 LAB — SURGICAL PCR SCREEN
MRSA, PCR: NEGATIVE
Staphylococcus aureus: NEGATIVE

## 2016-04-15 IMAGING — CR DG CHEST 2V
2 series · 2 of 2 positions shown · non-contrast
Comparison: [DATE].

CLINICAL DATA: 71-year-old male preoperative study for lumbar
surgery.

EXAM:
CHEST  2 VIEW

[w chest pa]
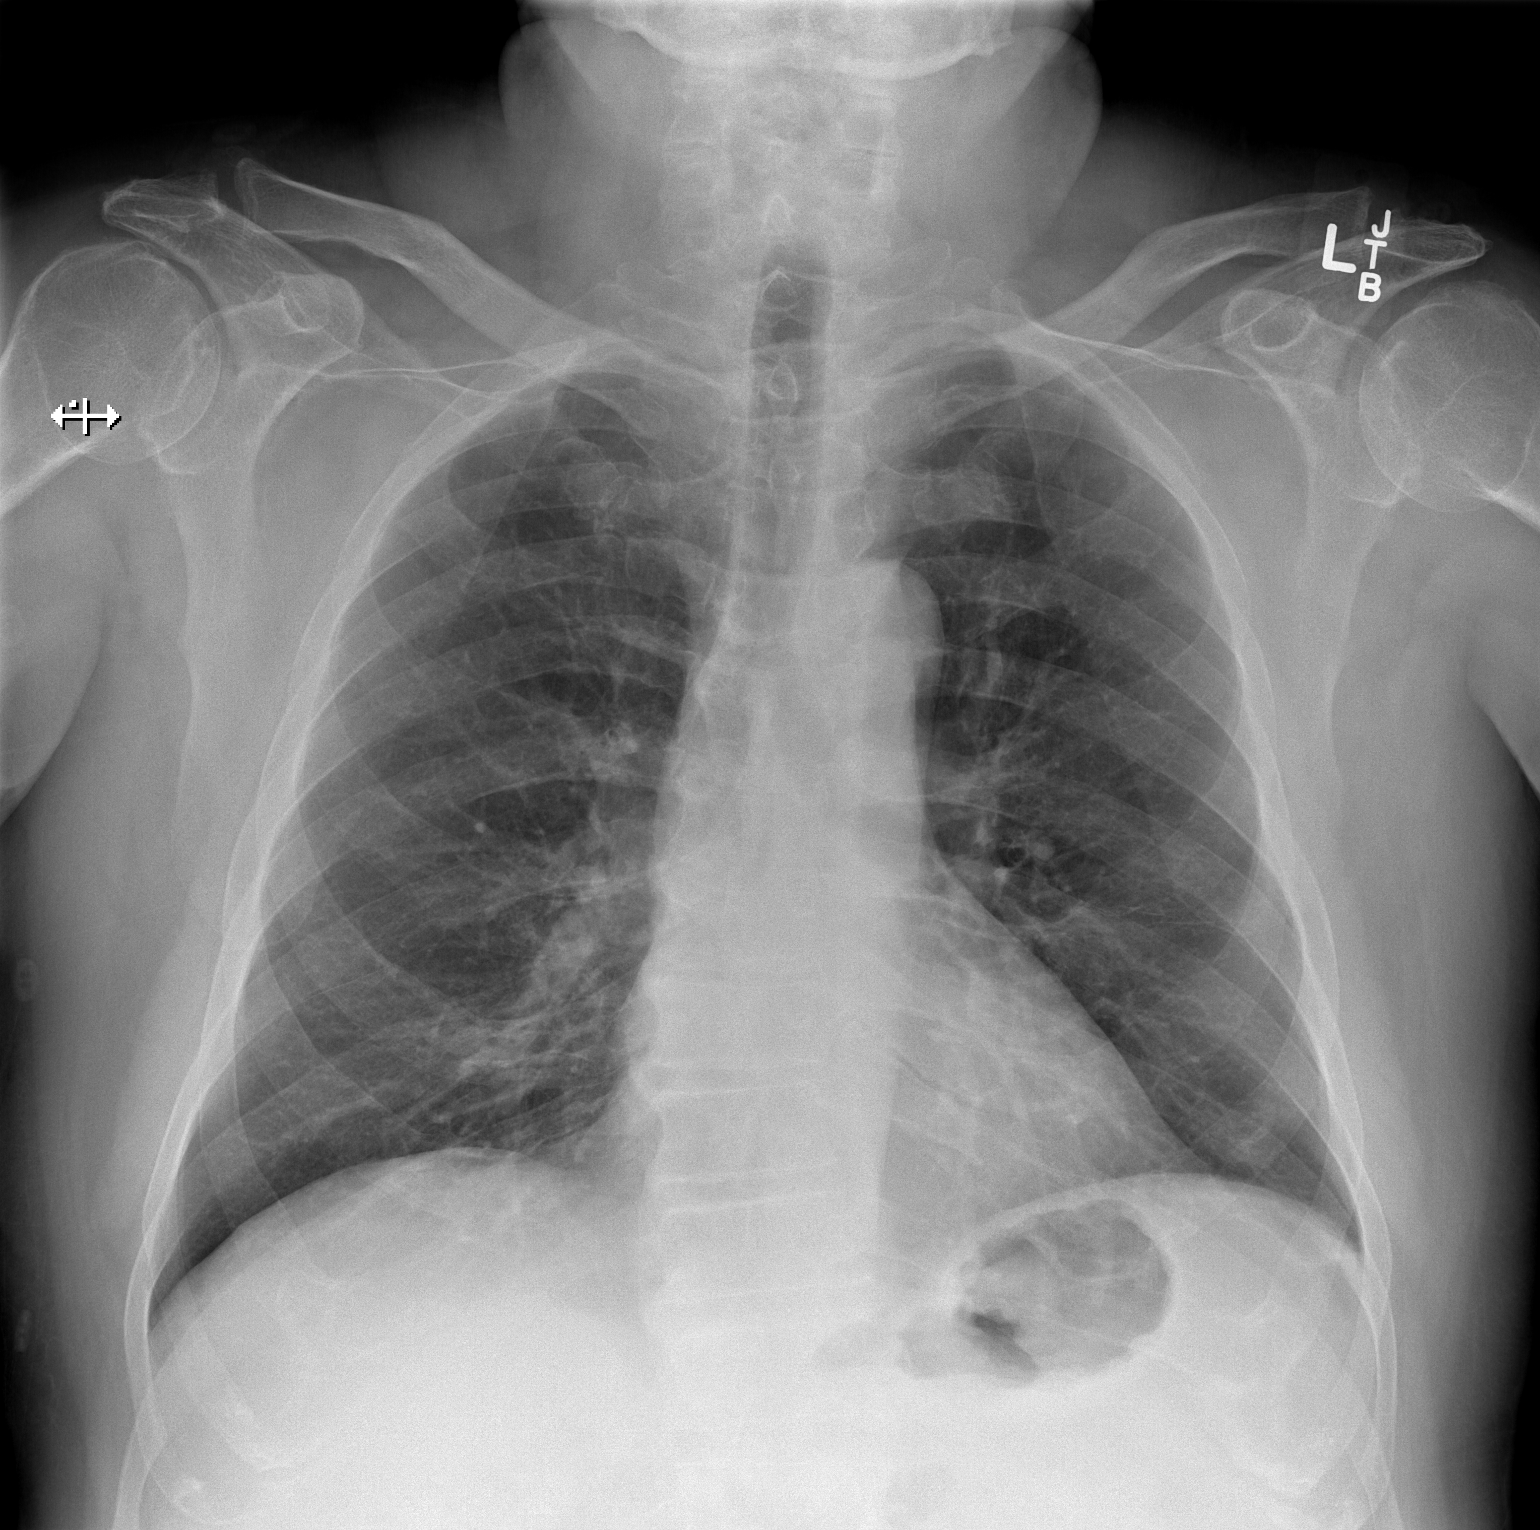

[w chest lat]
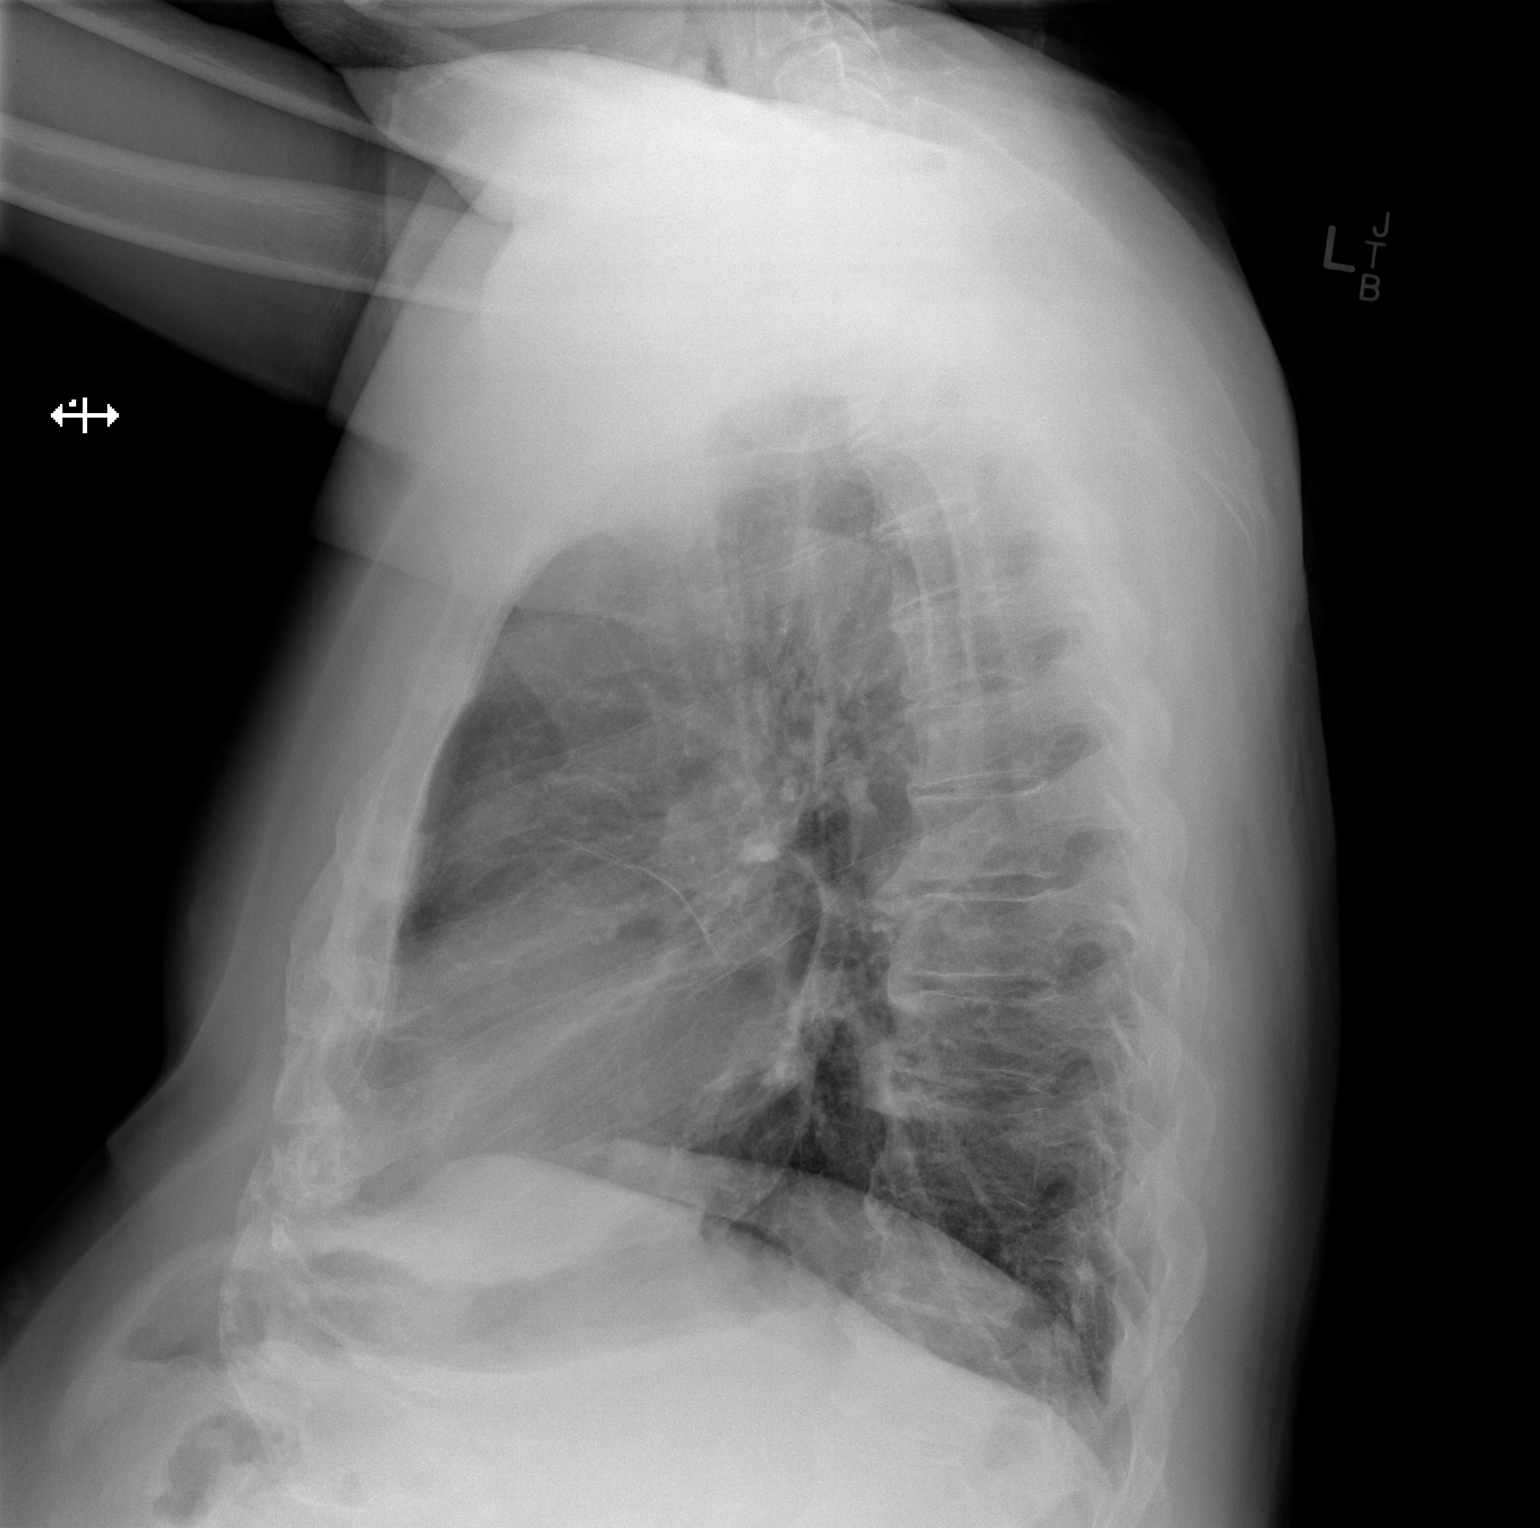

[2 of 2 positions shown; findings below may reference images not displayed]

FINDINGS: Improved lung volumes compared to [CS]. Mediastinal contours are
within normal limits. Visualized tracheal air column is within
normal limits. No pneumothorax, pulmonary edema, pleural effusion or
confluent pulmonary opacity. Flowing osteophytes in the mid and
lower thoracic spine. No acute osseous abnormality identified.
Negative visible bowel gas pattern.
IMPRESSION: No acute cardiopulmonary abnormality.

## 2016-04-15 MED ORDER — CHLORHEXIDINE GLUCONATE CLOTH 2 % EX PADS: 6.0000 | MEDICATED_PAD | Freq: Once | CUTANEOUS | Status: DC

## 2016-04-15 NOTE — Final Progress Note (Signed)
Will request past ekg from Dr Dagmar Hait

## 2016-04-19 NOTE — Progress Notes (Signed)
Anesthesia chart review: Patient is a 71 year old male scheduled for laminectomy and foraminotomy L2-3, L3-4, L4-5 with sublaminar decompression on 04/22/2016 by Dr. Ronnald Ramp.  History includes former smoker, hypertension, arthritis, partial SBO '11, adenomatous colonic polyps (repeat colonoscopy '19, Dr. Carlean Purl), seasonal allergies, appendectomy. Colon cancer is listed in his history, but I only see records in [place] indicating pre-cancerous adenomatous colon polyps in 05/2014 with GI recommending next colonoscopy in 2019. (Records also indicate that a referral erroneously was sent to [facility] Dr. Tammi Klippel instead of [facility] Dr. Tresa Moore 01/2016.) BMI is consistent with obesity.   PCP is Dr. Dagmar Hait with [facility].   Meds include aspirin 81 mg (to hold), cranberry, Flonase, losartan-HCTZ, fish oil, pravastatin.  EKG 04/15/16 (x2): Normal sinus rhythm, cannot rule out inferior infarct, age undetermined. Possible inferior infarct changes also noted on prior tracing from 12/31/09.  Carotid U/S 07/02/15: IMPRESSION: Less than 50% stenosis in the right and left internal carotid arteries.  CXR 04/15/16: IMPRESSION: No acute cardiopulmonary abnormality.  Preoperative labs noted. Potassium 3.4. Creatinine 1.23. Glucose 103. WBC 5.8. H&H 15.5 and 45.1. Platelet count 158.  If no acute changes then I anticipate that he can proceed as planned.  George Hugh [facility] Short Stay Center/Anesthesiology Phone [phone] 04/19/2016 12:12 PM

## 2016-04-21 NOTE — Anesthesia Preprocedure Evaluation (Addendum)
Anesthesia Evaluation  Patient identified by MRN, date of birth, ID band Patient awake    Reviewed: Allergy & Precautions, NPO status , Patient's Chart, lab work & pertinent test results  Airway Mallampati: II  TM Distance: >3 FB     Dental   Pulmonary former smoker,    breath sounds clear to auscultation       Cardiovascular hypertension,  Rhythm:Regular Rate:Normal     Neuro/Psych    GI/Hepatic negative GI ROS, Neg liver ROS,   Endo/Other  negative endocrine ROS  Renal/GU negative Renal ROS     Musculoskeletal  (+) Arthritis ,   Abdominal   Peds  Hematology   Anesthesia Other Findings   Reproductive/Obstetrics                            Anesthesia Physical Anesthesia Plan  ASA: III  Anesthesia Plan: General   Post-op Pain Management:    Induction: Intravenous  Airway Management Planned: Oral ETT  Additional Equipment:   Intra-op Plan:   Post-operative Plan:   Informed Consent: I have reviewed the patients History and Physical, chart, labs and discussed the procedure including the risks, benefits and alternatives for the proposed anesthesia with the patient or authorized representative who has indicated his/her understanding and acceptance.   Dental advisory given  Plan Discussed with: CRNA and Anesthesiologist  Anesthesia Plan Comments:        Anesthesia Quick Evaluation

## 2016-04-22 ENCOUNTER — Observation Stay (HOSPITAL_COMMUNITY)
Admission: RE | Admit: 2016-04-22 | Discharge: 2016-04-22 | Disposition: A | Discharge status: Home / Self Care | Payer: Medicare Other | Source: Ambulatory Visit | Attending: Neurological Surgery | Admitting: Neurological Surgery

## 2016-04-22 ENCOUNTER — Ambulatory Visit (HOSPITAL_COMMUNITY): Payer: Medicare Other

## 2016-04-22 ENCOUNTER — Ambulatory Visit (HOSPITAL_COMMUNITY): Payer: Medicare Other | Admitting: Anesthesiology

## 2016-04-22 ENCOUNTER — Ambulatory Visit (HOSPITAL_COMMUNITY): Payer: Medicare Other | Admitting: Vascular Surgery

## 2016-04-22 ENCOUNTER — Encounter (HOSPITAL_COMMUNITY)
Admission: RE | Disposition: A | Discharge status: Home / Self Care | Payer: Self-pay | Source: Ambulatory Visit | Attending: Neurological Surgery

## 2016-04-22 ENCOUNTER — Encounter (HOSPITAL_COMMUNITY): Payer: Self-pay | Admitting: Certified Registered Nurse Anesthetist

## 2016-04-22 DIAGNOSIS — Z87891 Personal history of nicotine dependence: Secondary | ICD-10-CM | POA: Insufficient documentation

## 2016-04-22 DIAGNOSIS — Z419 Encounter for procedure for purposes other than remedying health state, unspecified: Secondary | ICD-10-CM

## 2016-04-22 DIAGNOSIS — Z7951 Long term (current) use of inhaled steroids: Secondary | ICD-10-CM | POA: Diagnosis not present

## 2016-04-22 DIAGNOSIS — I10 Essential (primary) hypertension: Secondary | ICD-10-CM | POA: Diagnosis not present

## 2016-04-22 DIAGNOSIS — Z7982 Long term (current) use of aspirin: Secondary | ICD-10-CM | POA: Insufficient documentation

## 2016-04-22 DIAGNOSIS — M48061 Spinal stenosis, lumbar region without neurogenic claudication: Secondary | ICD-10-CM | POA: Diagnosis not present

## 2016-04-22 DIAGNOSIS — Z79899 Other long term (current) drug therapy: Secondary | ICD-10-CM | POA: Insufficient documentation

## 2016-04-22 DIAGNOSIS — Z9889 Other specified postprocedural states: Secondary | ICD-10-CM

## 2016-04-22 HISTORY — PX: LUMBAR LAMINECTOMY/DECOMPRESSION MICRODISCECTOMY: SHX5026

## 2016-04-22 SURGERY — LUMBAR LAMINECTOMY/DECOMPRESSION MICRODISCECTOMY 3 LEVELS
Anesthesia: General | Site: Spine Lumbar

## 2016-04-22 MED ORDER — SODIUM CHLORIDE 0.9 % IV SOLN: 250.0000 mL | INTRAVENOUS | Status: DC

## 2016-04-22 MED ORDER — ROCURONIUM BROMIDE 100 MG/10ML IV SOLN
INTRAVENOUS | Status: DC | PRN
  Administered 2016-04-22: 50 mg via INTRAVENOUS
  Administered 2016-04-22: 10 mg via INTRAVENOUS

## 2016-04-22 MED ORDER — LACTATED RINGERS IV SOLN
INTRAVENOUS | Status: DC
  Administered 2016-04-22 (×2): via INTRAVENOUS

## 2016-04-22 MED ORDER — THROMBIN 5000 UNITS EX SOLR
OROMUCOSAL | Status: DC | PRN
  Administered 2016-04-22: 5 mL via TOPICAL

## 2016-04-22 MED ORDER — PHENOL 1.4 % MT LIQD: 1.0000 | OROMUCOSAL | Status: DC | PRN

## 2016-04-22 MED ORDER — SODIUM CHLORIDE 0.9% FLUSH: 3.0000 mL | Freq: Two times a day (BID) | INTRAVENOUS | Status: DC

## 2016-04-22 MED ORDER — HYDROCODONE-ACETAMINOPHEN 7.5-325 MG PO TABS
1.0000 | ORAL_TABLET | Freq: Four times a day (QID) | ORAL | Status: DC
  Administered 2016-04-22: 1 via ORAL

## 2016-04-22 MED ORDER — LOSARTAN POTASSIUM 50 MG PO TABS
50.0000 mg | ORAL_TABLET | Freq: Two times a day (BID) | ORAL | Status: DC
  Filled 2016-04-22: qty 1

## 2016-04-22 MED ORDER — HYDROCODONE-ACETAMINOPHEN 7.5-325 MG PO TABS
ORAL_TABLET | ORAL | Status: AC
  Filled 2016-04-22: qty 1

## 2016-04-22 MED ORDER — PHENYLEPHRINE 40 MCG/ML (10ML) SYRINGE FOR IV PUSH (FOR BLOOD PRESSURE SUPPORT)
PREFILLED_SYRINGE | INTRAVENOUS | Status: AC
  Filled 2016-04-22: qty 10

## 2016-04-22 MED ORDER — CEFAZOLIN SODIUM-DEXTROSE 2-4 GM/100ML-% IV SOLN
2.0000 g | INTRAVENOUS | Status: AC
  Administered 2016-04-22: 2 g via INTRAVENOUS
  Filled 2016-04-22: qty 100

## 2016-04-22 MED ORDER — THROMBIN 5000 UNITS EX SOLR
CUTANEOUS | Status: AC
  Filled 2016-04-22: qty 15000

## 2016-04-22 MED ORDER — SUGAMMADEX SODIUM 200 MG/2ML IV SOLN
INTRAVENOUS | Status: DC | PRN
  Administered 2016-04-22: 250 mg via INTRAVENOUS

## 2016-04-22 MED ORDER — PROPOFOL 10 MG/ML IV BOLUS
INTRAVENOUS | Status: DC | PRN
  Administered 2016-04-22: 150 mg via INTRAVENOUS

## 2016-04-22 MED ORDER — ONDANSETRON HCL 4 MG/2ML IJ SOLN: 4.0000 mg | Freq: Four times a day (QID) | INTRAMUSCULAR | Status: DC | PRN

## 2016-04-22 MED ORDER — ACETAMINOPHEN 650 MG RE SUPP: 650.0000 mg | RECTAL | Status: DC | PRN

## 2016-04-22 MED ORDER — LOSARTAN POTASSIUM-HCTZ 100-25 MG PO TABS: 0.5000 | ORAL_TABLET | Freq: Two times a day (BID) | ORAL | Status: DC

## 2016-04-22 MED ORDER — METHOCARBAMOL 500 MG PO TABS: 500.0000 mg | ORAL_TABLET | Freq: Four times a day (QID) | ORAL | 1 refills | Status: DC | PRN

## 2016-04-22 MED ORDER — ONDANSETRON HCL 4 MG/2ML IJ SOLN
INTRAMUSCULAR | Status: DC | PRN
  Administered 2016-04-22: 4 mg via INTRAVENOUS

## 2016-04-22 MED ORDER — METHOCARBAMOL 500 MG PO TABS
ORAL_TABLET | ORAL | Status: AC
  Filled 2016-04-22: qty 1

## 2016-04-22 MED ORDER — HYDROCODONE-ACETAMINOPHEN 7.5-325 MG PO TABS
1.0000 | ORAL_TABLET | Freq: Four times a day (QID) | ORAL | 0 refills | Status: DC
Start: 2016-04-22 — End: 2017-05-25

## 2016-04-22 MED ORDER — FENTANYL CITRATE (PF) 100 MCG/2ML IJ SOLN
INTRAMUSCULAR | Status: DC | PRN
  Administered 2016-04-22: 50 ug via INTRAVENOUS
  Administered 2016-04-22: 100 ug via INTRAVENOUS
  Administered 2016-04-22 (×2): 50 ug via INTRAVENOUS

## 2016-04-22 MED ORDER — LIDOCAINE HCL (CARDIAC) 20 MG/ML IV SOLN
INTRAVENOUS | Status: DC | PRN
  Administered 2016-04-22: 80 mg via INTRAVENOUS

## 2016-04-22 MED ORDER — MORPHINE SULFATE (PF) 4 MG/ML IV SOLN: 2.0000 mg | INTRAVENOUS | Status: DC | PRN

## 2016-04-22 MED ORDER — FENTANYL CITRATE (PF) 250 MCG/5ML IJ SOLN
INTRAMUSCULAR | Status: AC
  Filled 2016-04-22: qty 5

## 2016-04-22 MED ORDER — SODIUM CHLORIDE 0.9 % IR SOLN
Status: DC | PRN
  Administered 2016-04-22: 500 mL

## 2016-04-22 MED ORDER — SUGAMMADEX SODIUM 200 MG/2ML IV SOLN
INTRAVENOUS | Status: AC
  Filled 2016-04-22: qty 4

## 2016-04-22 MED ORDER — MENTHOL 3 MG MT LOZG: 1.0000 | LOZENGE | OROMUCOSAL | Status: DC | PRN

## 2016-04-22 MED ORDER — CEFAZOLIN SODIUM-DEXTROSE 2-4 GM/100ML-% IV SOLN
2.0000 g | Freq: Three times a day (TID) | INTRAVENOUS | Status: DC
  Administered 2016-04-22: 2 g via INTRAVENOUS
  Filled 2016-04-22: qty 100

## 2016-04-22 MED ORDER — EPHEDRINE 5 MG/ML INJ
INTRAVENOUS | Status: AC
  Filled 2016-04-22: qty 10

## 2016-04-22 MED ORDER — POTASSIUM CHLORIDE IN NACL 20-0.9 MEQ/L-% IV SOLN: INTRAVENOUS | Status: DC

## 2016-04-22 MED ORDER — BUPIVACAINE HCL (PF) 0.25 % IJ SOLN
INTRAMUSCULAR | Status: DC | PRN
  Administered 2016-04-22: 4 mL

## 2016-04-22 MED ORDER — METHOCARBAMOL 1000 MG/10ML IJ SOLN
500.0000 mg | Freq: Four times a day (QID) | INTRAMUSCULAR | Status: DC | PRN
  Filled 2016-04-22: qty 5

## 2016-04-22 MED ORDER — PHENYLEPHRINE HCL 10 MG/ML IJ SOLN
INTRAMUSCULAR | Status: DC | PRN
  Administered 2016-04-22: 80 ug via INTRAVENOUS

## 2016-04-22 MED ORDER — PROPOFOL 10 MG/ML IV BOLUS
INTRAVENOUS | Status: AC
  Filled 2016-04-22: qty 20

## 2016-04-22 MED ORDER — SODIUM CHLORIDE 0.9% FLUSH: 3.0000 mL | INTRAVENOUS | Status: DC | PRN

## 2016-04-22 MED ORDER — HYDROMORPHONE HCL 1 MG/ML IJ SOLN
INTRAMUSCULAR | Status: AC
  Filled 2016-04-22: qty 0.5

## 2016-04-22 MED ORDER — THROMBIN 5000 UNITS EX SOLR
CUTANEOUS | Status: DC | PRN
  Administered 2016-04-22 (×2): 5000 [IU] via TOPICAL

## 2016-04-22 MED ORDER — 0.9 % SODIUM CHLORIDE (POUR BTL) OPTIME
TOPICAL | Status: DC | PRN
  Administered 2016-04-22: 1000 mL

## 2016-04-22 MED ORDER — EPHEDRINE SULFATE 50 MG/ML IJ SOLN
INTRAMUSCULAR | Status: DC | PRN
  Administered 2016-04-22: 5 mg via INTRAVENOUS
  Administered 2016-04-22: 10 mg via INTRAVENOUS
  Administered 2016-04-22 (×3): 5 mg via INTRAVENOUS

## 2016-04-22 MED ORDER — SENNA 8.6 MG PO TABS: 1.0000 | ORAL_TABLET | Freq: Two times a day (BID) | ORAL | Status: DC

## 2016-04-22 MED ORDER — HYDROCHLOROTHIAZIDE 12.5 MG PO CAPS
12.5000 mg | ORAL_CAPSULE | Freq: Every day | ORAL | Status: DC
  Filled 2016-04-22: qty 1

## 2016-04-22 MED ORDER — HEMOSTATIC AGENTS (NO CHARGE) OPTIME
TOPICAL | Status: DC | PRN
  Administered 2016-04-22: 1 via TOPICAL

## 2016-04-22 MED ORDER — BUPIVACAINE HCL (PF) 0.25 % IJ SOLN
INTRAMUSCULAR | Status: AC
  Filled 2016-04-22: qty 30

## 2016-04-22 MED ORDER — PROPOFOL 10 MG/ML IV BOLUS
INTRAVENOUS | Status: AC
Start: 2016-04-22 — End: ?
  Filled 2016-04-22: qty 20

## 2016-04-22 MED ORDER — HYDROMORPHONE HCL 1 MG/ML IJ SOLN
0.2500 mg | INTRAMUSCULAR | Status: DC | PRN
  Administered 2016-04-22 (×2): 0.5 mg via INTRAVENOUS

## 2016-04-22 MED ORDER — ASPIRIN EC 81 MG PO TBEC: 81.0000 mg | DELAYED_RELEASE_TABLET | Freq: Every day | ORAL | Status: DC

## 2016-04-22 MED ORDER — ONDANSETRON HCL 4 MG PO TABS
4.0000 mg | ORAL_TABLET | Freq: Four times a day (QID) | ORAL | Status: DC | PRN
Start: 2016-04-22 — End: 2016-04-22

## 2016-04-22 MED ORDER — ONDANSETRON HCL 4 MG/2ML IJ SOLN
INTRAMUSCULAR | Status: AC
  Filled 2016-04-22: qty 2

## 2016-04-22 MED ORDER — DEXAMETHASONE SODIUM PHOSPHATE 10 MG/ML IJ SOLN
10.0000 mg | INTRAMUSCULAR | Status: AC
  Administered 2016-04-22: 10 mg via INTRAVENOUS
  Filled 2016-04-22: qty 1

## 2016-04-22 MED ORDER — ACETAMINOPHEN 325 MG PO TABS: 650.0000 mg | ORAL_TABLET | ORAL | Status: DC | PRN

## 2016-04-22 MED ORDER — METHOCARBAMOL 500 MG PO TABS
500.0000 mg | ORAL_TABLET | Freq: Four times a day (QID) | ORAL | Status: DC | PRN
  Administered 2016-04-22: 500 mg via ORAL

## 2016-04-22 SURGICAL SUPPLY — 49 items
BAG DECANTER FOR FLEXI CONT (MISCELLANEOUS) ×3 IMPLANT
BENZOIN TINCTURE PRP APPL 2/3 (GAUZE/BANDAGES/DRESSINGS) ×3 IMPLANT
BUR MATCHSTICK NEURO 3.0 LAGG (BURR) ×3 IMPLANT
CANISTER SUCT 3000ML PPV (MISCELLANEOUS) ×3 IMPLANT
CARTRIDGE OIL MAESTRO DRILL (MISCELLANEOUS) ×1 IMPLANT
CLOSURE WOUND 1/2 X4 (GAUZE/BANDAGES/DRESSINGS) ×1
DIFFUSER DRILL AIR PNEUMATIC (MISCELLANEOUS) ×3 IMPLANT
DRAPE LAPAROTOMY 100X72X124 (DRAPES) ×3 IMPLANT
DRAPE MICROSCOPE LEICA (MISCELLANEOUS) ×3 IMPLANT
DRAPE POUCH INSTRU U-SHP 10X18 (DRAPES) ×3 IMPLANT
DRAPE SURG 17X23 STRL (DRAPES) ×3 IMPLANT
DRSG OPSITE POSTOP 4X6 (GAUZE/BANDAGES/DRESSINGS) ×3 IMPLANT
DURAPREP 26ML APPLICATOR (WOUND CARE) ×3 IMPLANT
ELECT REM PT RETURN 9FT ADLT (ELECTROSURGICAL) ×3
ELECTRODE REM PT RTRN 9FT ADLT (ELECTROSURGICAL) ×1 IMPLANT
GAUZE SPONGE 4X4 16PLY XRAY LF (GAUZE/BANDAGES/DRESSINGS) IMPLANT
GLOVE BIO SURGEON STRL SZ7 (GLOVE) IMPLANT
GLOVE BIO SURGEON STRL SZ8 (GLOVE) ×3 IMPLANT
GLOVE BIOGEL PI IND STRL 7.0 (GLOVE) ×1 IMPLANT
GLOVE BIOGEL PI IND STRL 7.5 (GLOVE) ×1 IMPLANT
GLOVE BIOGEL PI INDICATOR 7.0 (GLOVE) ×2
GLOVE BIOGEL PI INDICATOR 7.5 (GLOVE) ×2
GLOVE SS BIOGEL STRL SZ 7.5 (GLOVE) ×1 IMPLANT
GLOVE SUPERSENSE BIOGEL SZ 7.5 (GLOVE) ×2
GLOVE SURG SS PI 7.5 STRL IVOR (GLOVE) ×6 IMPLANT
GOWN STRL REUS W/ TWL LRG LVL3 (GOWN DISPOSABLE) ×2 IMPLANT
GOWN STRL REUS W/ TWL XL LVL3 (GOWN DISPOSABLE) ×1 IMPLANT
GOWN STRL REUS W/TWL 2XL LVL3 (GOWN DISPOSABLE) IMPLANT
GOWN STRL REUS W/TWL LRG LVL3 (GOWN DISPOSABLE) ×4
GOWN STRL REUS W/TWL XL LVL3 (GOWN DISPOSABLE) ×2
HEMOSTAT POWDER KIT SURGIFOAM (HEMOSTASIS) ×3 IMPLANT
KIT BASIN OR (CUSTOM PROCEDURE TRAY) ×3 IMPLANT
KIT ROOM TURNOVER OR (KITS) ×3 IMPLANT
NEEDLE HYPO 25X1 1.5 SAFETY (NEEDLE) ×3 IMPLANT
NEEDLE SPNL 20GX3.5 QUINCKE YW (NEEDLE) IMPLANT
NS IRRIG 1000ML POUR BTL (IV SOLUTION) ×3 IMPLANT
OIL CARTRIDGE MAESTRO DRILL (MISCELLANEOUS) ×3
PACK LAMINECTOMY NEURO (CUSTOM PROCEDURE TRAY) ×3 IMPLANT
PAD ARMBOARD 7.5X6 YLW CONV (MISCELLANEOUS) ×15 IMPLANT
RUBBERBAND STERILE (MISCELLANEOUS) ×6 IMPLANT
SPONGE SURGIFOAM ABS GEL SZ50 (HEMOSTASIS) ×3 IMPLANT
STRIP CLOSURE SKIN 1/2X4 (GAUZE/BANDAGES/DRESSINGS) ×2 IMPLANT
SUT VIC AB 0 CT1 18XCR BRD8 (SUTURE) ×1 IMPLANT
SUT VIC AB 0 CT1 8-18 (SUTURE) ×2
SUT VIC AB 2-0 CP2 18 (SUTURE) ×3 IMPLANT
SUT VIC AB 3-0 SH 8-18 (SUTURE) ×6 IMPLANT
TOWEL GREEN STERILE (TOWEL DISPOSABLE) ×3 IMPLANT
TOWEL GREEN STERILE FF (TOWEL DISPOSABLE) ×2 IMPLANT
WATER STERILE IRR 1000ML POUR (IV SOLUTION) ×3 IMPLANT

## 2016-04-22 NOTE — H&P (Signed)
Subjective: Patient is a 71 y.o. male admitted for stenosis. Onset of symptoms was several months ago, gradually worsening since that time.  The pain is rated severe, and is located at the across the lower back and radiates to LLE. The pain is described as aching and occurs all day. The symptoms have been progressive. Symptoms are exacerbated by exercise. MRI or CT showed stenosis   Past Medical History:  Diagnosis Date  . Allergy    sulfa and seasonal   . Arthritis   . Colon cancer ([place])   . Hx of adenomatous colonic polyps 05/27/2014  . Hypertension   . Seasonal allergies     Past Surgical History:  Procedure Laterality Date  . APPENDECTOMY  1980  . COLONOSCOPY      Prior to Admission medications   Medication Sig Start Date End Date Taking? Authorizing Provider  aspirin EC 81 MG tablet Take 81 mg by mouth at bedtime.    Yes Historical Provider, MD  CRANBERRY PO Take 1,500 mg by mouth daily.   Yes Historical Provider, MD  docusate sodium (COLACE) 100 MG capsule Take 200 mg by mouth daily.   Yes Historical Provider, MD  fluticasone (FLONASE) 50 MCG/ACT nasal spray Place 1 spray into both nostrils at bedtime.   Yes Historical Provider, MD  losartan-hydrochlorothiazide (HYZAAR) 100-25 MG per tablet Take 0.5 tablets by mouth 2 (two) times daily.   Yes Historical Provider, MD  Multiple Vitamin (MULTIVITAMIN WITH MINERALS) TABS tablet Take 1 tablet by mouth daily.   Yes Historical Provider, MD  Omega-3 Fatty Acids (FISH OIL) 1200 MG CAPS Take 1,200 mg by mouth daily.   Yes Historical Provider, MD  pravastatin (PRAVACHOL) 20 MG tablet Take 20 mg by mouth at bedtime. 01/22/16  Yes Historical Provider, MD  azithromycin (ZITHROMAX Z-PAK) 250 MG tablet Take 2 pills today, then 1 pill daily until gone. Patient not taking: Reported on 04/12/2016 02/15/16   Melony Overly, MD  benzonatate (TESSALON) 100 MG capsule Take 1 capsule (100 mg total) by mouth every 8 (eight) hours. Patient not taking: Reported  on 04/12/2016 02/15/16   Melony Overly, MD  predniSONE (DELTASONE) 50 MG tablet Take 1 pill daily for 5 days. Patient not taking: Reported on 04/12/2016 02/15/16   Melony Overly, MD   Allergies  Allergen Reactions  . Sulfa Antibiotics Nausea And Vomiting    Social History  Substance Use Topics  . Smoking status: Former Research scientist (life sciences)  . Smokeless tobacco: Never Used  . Alcohol use Yes     Comment: occasional    Family History  Problem Relation Age of Onset  . Colon cancer Neg Hx   . Esophageal cancer Neg Hx   . Rectal cancer Neg Hx   . Stomach cancer Neg Hx      Review of Systems  Positive ROS:neg  All other systems have been reviewed and were otherwise negative with the exception of those mentioned in the HPI and as above.  Objective: Vital signs in last 24 hours: Temp:  [98.2 F (36.8 C)] 98.2 F (36.8 C) (03/29 0800) Pulse Rate:  [59] 59 (03/29 0800) Resp:  [20] 20 (03/29 0800) BP: (166)/(88) 166/88 (03/29 0800) SpO2:  [96 %] 96 % (03/29 0800) Weight:  [102.5 kg (226 lb)] 102.5 kg (226 lb) (03/29 0800)  General Appearance: Alert, cooperative, no distress, appears stated age Head: Normocephalic, without obvious abnormality, atraumatic Eyes: PERRL, conjunctiva/corneas clear, EOM's intact    Neck: Supple, symmetrical, trachea midline Back:  Symmetric, no curvature, ROM normal, no CVA tenderness Lungs:  respirations unlabored Heart: Regular rate and rhythm Abdomen: Soft, non-tender Extremities: Extremities normal, atraumatic, no cyanosis or edema Pulses: 2+ and symmetric all extremities Skin: Skin color, texture, turgor normal, no rashes or lesions  NEUROLOGIC:   Mental status: Alert and oriented x4,  no aphasia, good attention span, fund of knowledge, and memory Motor Exam - grossly normal Sensory Exam - grossly normal Reflexes: 1+ Coordination - grossly normal Gait - grossly normal Balance - grossly normal Cranial Nerves: I: smell Not tested  II: visual acuity  OS: nl     OD: nl  II: visual fields Full to confrontation  II: pupils Equal, round, reactive to light  III,VII: ptosis None  III,IV,VI: extraocular muscles  Full ROM  V: mastication Normal  V: facial light touch sensation  Normal  V,VII: corneal reflex  Present  VII: facial muscle function - upper  Normal  VII: facial muscle function - lower Normal  VIII: hearing Not tested  IX: soft palate elevation  Normal  IX,X: gag reflex Present  XI: trapezius strength  5/5  XI: sternocleidomastoid strength 5/5  XI: neck flexion strength  5/5  XII: tongue strength  Normal    Data Review Lab Results  Component Value Date   WBC 5.8 04/15/2016   HGB 15.5 04/15/2016   HCT 45.1 04/15/2016   MCV 86.2 04/15/2016   PLT 158 04/15/2016   Lab Results  Component Value Date   NA 141 04/15/2016   K 3.4 (L) 04/15/2016   CL 105 04/15/2016   CO2 27 04/15/2016   BUN 13 04/15/2016   CREATININE 1.23 04/15/2016   GLUCOSE 103 (H) 04/15/2016   Lab Results  Component Value Date   INR 1.05 12/31/2009    Assessment/Plan: 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 Patient has failed a reasonable attempt at conservative therapy.  I explained the condition and procedure to the patient and answered any questions.  Patient wishes to proceed with procedure as planned. Understands risks/ benefits and typical outcomes of procedure.   Steven Holloway S 04/22/2016 8:50 AM

## 2016-04-22 NOTE — Progress Notes (Signed)
Discharged instructions/education/Rx/AVS given to patient with wife at bedside and they both verbalized understanding. Patient voiding well with no difficulty. MAE well and ambulating independently. No swelling, no drainage, no redness noted on incision site. D/C via wheelchair.

## 2016-04-22 NOTE — Transfer of Care (Signed)
Immediate Anesthesia Transfer of Care Note  Patient: Steven Holloway  Procedure(s) Performed: Procedure(s): Laminectomy and Foraminotomy - Lumbar Two-Three,Lumbar Three-Four,Lumbar Four-Five with sublaminar decompression (N/A)  Patient Location: PACU  Anesthesia Type:General  Level of Consciousness: awake and alert   Airway & Oxygen Therapy: Patient Spontanous Breathing and Patient connected to face mask oxygen  Post-op Assessment: Report given to RN and Post -op Vital signs reviewed and stable  Post vital signs: Reviewed and stable  Last Vitals:  Vitals:   04/22/16 0800 04/22/16 1104  BP: (!) 166/88 (!) 143/75  Pulse: (!) 59   Resp: 20   Temp: 36.8 C 36.6 C    Last Pain: There were no vitals filed for this visit.    Patients Stated Pain Goal: 3 (03/70/48 8891)  Complications: No apparent anesthesia complications

## 2016-04-22 NOTE — Anesthesia Postprocedure Evaluation (Signed)
Anesthesia Post Note  Patient: Steven Holloway  Procedure(s) Performed: Procedure(s) (LRB): Laminectomy and Foraminotomy - Lumbar Two-Three,Lumbar Three-Four,Lumbar Four-Five with sublaminar decompression (N/A)  Patient location during evaluation: PACU Anesthesia Type: General Level of consciousness: awake Pain management: pain level controlled Respiratory status: spontaneous breathing Cardiovascular status: stable Anesthetic complications: no       Last Vitals:  Vitals:   04/22/16 1154 04/22/16 1208  BP:  (!) 147/84  Pulse: 84 78  Resp: 13 18  Temp: 36.5 C 36.4 C    Last Pain:  Vitals:   04/22/16 1208  TempSrc: Oral  PainSc: 2                  Isidra Mings

## 2016-04-22 NOTE — Discharge Summary (Signed)
Physician Discharge Summary  Patient ID: Steven Holloway MRN: [id number] DOB/AGE: 1945-07-25 71 y.o.  Admit date: 04/22/2016 Discharge date: 04/22/2016  Admission Diagnoses: lumbar stenosis    Discharge Diagnoses: same   Discharged Condition: good  Hospital Course: The patient was admitted on 04/22/2016 and taken to the operating room where the patient underwent Lum lam for stenosis. The patient tolerated the procedure well and was taken to the recovery room and then to the floor in stable condition. The hospital course was routine. There were no complications. The wound remained clean dry and intact. Pt had appropriate back soreness. No complaints of leg pain or new N/T/W. The patient remained afebrile with stable vital signs, and tolerated a regular diet. The patient continued to increase activities, and pain was well controlled with oral pain medications.   Consults: None  Significant Diagnostic Studies:  Results for orders placed or performed during the hospital encounter of 04/15/16  Surgical pcr screen  Result Value Ref Range   MRSA, PCR NEGATIVE NEGATIVE   Staphylococcus aureus NEGATIVE NEGATIVE  CBC  Result Value Ref Range   WBC 5.8 4.0 - 10.5 K/uL   RBC 5.23 4.22 - 5.81 MIL/uL   Hemoglobin 15.5 13.0 - 17.0 g/dL   HCT 45.1 39.0 - 52.0 %   MCV 86.2 78.0 - 100.0 fL   MCH 29.6 26.0 - 34.0 pg   MCHC 34.4 30.0 - 36.0 g/dL   RDW 14.2 11.5 - 15.5 %   Platelets 158 150 - 400 K/uL  Basic metabolic panel  Result Value Ref Range   Sodium 141 135 - 145 mmol/L   Potassium 3.4 (L) 3.5 - 5.1 mmol/L   Chloride 105 101 - 111 mmol/L   CO2 27 22 - 32 mmol/L   Glucose, Bld 103 (H) 65 - 99 mg/dL   BUN 13 6 - 20 mg/dL   Creatinine, Ser 1.23 0.61 - 1.24 mg/dL   Calcium 9.5 8.9 - 10.3 mg/dL   GFR calc non Af Amer 57 (L) >60 mL/min   GFR calc Af Amer >60 >60 mL/min   Anion gap 9 5 - 15    Chest 2 View  Result Date: 04/15/2016 CLINICAL DATA:  71 year old male preoperative study for  lumbar surgery. EXAM: CHEST  2 VIEW COMPARISON:  12/31/2009. FINDINGS: Improved lung volumes compared to 2011. Mediastinal contours are within normal limits. Visualized tracheal air column is within normal limits. No pneumothorax, pulmonary edema, pleural effusion or confluent pulmonary opacity. Flowing osteophytes in the mid and lower thoracic spine. No acute osseous abnormality identified. Negative visible bowel gas pattern. IMPRESSION: No acute cardiopulmonary abnormality. Electronically Signed   By: Genevie Ann M.D.   On: 04/15/2016 09:08   Dg Lumbar Spine 1 View  Result Date: 04/22/2016 CLINICAL DATA:  L2-3 through L4-5 decompression EXAM: LUMBAR SPINE - 1 VIEW COMPARISON:  04/02/2016 FINDINGS: Single cross-table lateral view of the lumbar spine demonstrates posterior surgical instruments directed at the L3-4 level. IMPRESSION: Intraoperative localization as above. Electronically Signed   By: Rolm Baptise M.D.   On: 04/22/2016 10:01    Antibiotics:  Anti-infectives    Start     Dose/Rate Route Frequency Ordered Stop   04/22/16 1700  ceFAZolin (ANCEF) IVPB 2g/100 mL premix     2 g 200 mL/hr over 30 Minutes Intravenous Every 8 hours 04/22/16 1211 04/23/16 0859   04/22/16 1004  bacitracin 50,000 Units in sodium chloride irrigation 0.9 % 500 mL irrigation  Status:  Discontinued  As needed 04/22/16 1005 04/22/16 1057   04/22/16 0809  ceFAZolin (ANCEF) IVPB 2g/100 mL premix     2 g 200 mL/hr over 30 Minutes Intravenous On call to O.R. 04/22/16 0809 04/22/16 0939      Discharge Exam: Blood pressure (!) 147/84, pulse 78, temperature 97.6 F (36.4 C), temperature source Oral, resp. rate 18, weight 102.5 kg (226 lb), SpO2 97 %. Neurologic: Grossly normal Dressing dry  Discharge Medications:   Allergies as of 04/22/2016      Reactions   Sulfa Antibiotics Nausea And Vomiting      Medication List    TAKE these medications   aspirin EC 81 MG tablet Take 81 mg by mouth at bedtime.    azithromycin 250 MG tablet Commonly known as:  ZITHROMAX Z-PAK Take 2 pills today, then 1 pill daily until gone.   benzonatate 100 MG capsule Commonly known as:  TESSALON Take 1 capsule (100 mg total) by mouth every 8 (eight) hours.   CRANBERRY PO Take 1,500 mg by mouth daily.   docusate sodium 100 MG capsule Commonly known as:  COLACE Take 200 mg by mouth daily.   Fish Oil 1200 MG Caps Take 1,200 mg by mouth daily.   fluticasone 50 MCG/ACT nasal spray Commonly known as:  FLONASE Place 1 spray into both nostrils at bedtime.   HYDROcodone-acetaminophen 7.5-325 MG tablet Commonly known as:  NORCO Take 1 tablet by mouth every 6 (six) hours.   losartan-hydrochlorothiazide 100-25 MG tablet Commonly known as:  HYZAAR Take 0.5 tablets by mouth 2 (two) times daily.   methocarbamol 500 MG tablet Commonly known as:  ROBAXIN Take 1 tablet (500 mg total) by mouth every 6 (six) hours as needed for muscle spasms.   multivitamin with minerals Tabs tablet Take 1 tablet by mouth daily.   pravastatin 20 MG tablet Commonly known as:  PRAVACHOL Take 20 mg by mouth at bedtime.   predniSONE 50 MG tablet Commonly known as:  DELTASONE Take 1 pill daily for 5 days.       Disposition: home   Final Dx: LL for stenosis  Discharge Instructions     Remove dressing in 72 hours    Complete by:  As directed    Call MD for:  difficulty breathing, headache or visual disturbances    Complete by:  As directed    Call MD for:  persistant nausea and vomiting    Complete by:  As directed    Call MD for:  redness, tenderness, or signs of infection (pain, swelling, redness, odor or green/yellow discharge around incision site)    Complete by:  As directed    Call MD for:  severe uncontrolled pain    Complete by:  As directed    Call MD for:  temperature >100.4    Complete by:  As directed    Diet - low sodium heart healthy    Complete by:  As directed    Increase activity slowly    Complete  by:  As directed          Signed: Sloane Palmer S 04/22/2016, 3:56 PM

## 2016-04-22 NOTE — Discharge Instructions (Signed)

## 2016-04-22 NOTE — Op Note (Signed)
04/22/2016  10:57 AM  PATIENT:  Steven Holloway  71 y.o. male  PRE-OPERATIVE DIAGNOSIS:  Lumbar spinal stenosis L2-3 L3-4 L4-5 with left leg pain  POST-OPERATIVE DIAGNOSIS:  same  PROCEDURE:  Decompressive lumbar hemilaminectomy and medial facetectomy foraminotomy L2-3 L3-4 L4-5 left with sublaminar decompression at L3-4  SURGEON:  Sherley Bounds, MD  ASSISTANTS: Dr. Cyndy Freeze  ANESTHESIA:   General  EBL: 50 ml  Total I/O In: 1000 [I.V.:1000] Out: 50 [Blood:50]  BLOOD ADMINISTERED: none  DRAINS: none  SPECIMEN:  none  INDICATION FOR PROCEDURE: This patient presented with left hip and leg pain. Imaging showed severe spinal stenosis. The patient tried conservative measures without relief. Pain was debilitating. Recommended impressive hemilaminectomies. Patient understood the risks, benefits, and alternatives and potential outcomes and wished to proceed.  PROCEDURE DETAILS: The patient was taken to the operating room and after induction of adequate generalized endotracheal anesthesia, the patient was rolled into the prone position on the Wilson frame and all pressure points were padded. The lumbar region was cleaned and then prepped with DuraPrep and draped in the usual sterile fashion. 5 cc of local anesthesia was injected and then a dorsal midline incision was made and carried down to the lumbo sacral fascia. The fascia was opened and the paraspinous musculature was taken down in a subperiosteal fashion to expose L2-3 L3-4 and L4-5 on the left. Intraoperative x-ray confirmed my level, and then I used a combination of the high-speed drill and the Kerrison punches to perform a hemilaminectomy, medial facetectomy, and foraminotomy at L2-3 L3-4 and L4-5 on the left. A sublaminar decompression was performed at L3-4 . The underlying yellow ligament was opened and removed in a piecemeal fashion to expose the underlying dura and exiting nerve root. I undercut the lateral recess and dissected down until  I was medial to and distal to the pedicle at each level. The nerve root was well decompressed at each level. . I then palpated with a coronary dilator along the nerve root and into the foramen to assure adequate decompression. I felt no more compression of the nerve root. I irrigated with saline solution containing bacitracin. Achieved hemostasis with bipolar cautery, lined the dura with Gelfoam, and then closed the fascia with 0 Vicryl. I closed the subcutaneous tissues with 2-0 Vicryl and the subcuticular tissues with 3-0 Vicryl. The skin was then closed with benzoin and Steri-Strips. The drapes were removed, a sterile dressing was applied. The patient was awakened from general anesthesia and transferred to the recovery room in stable condition. At the end of the procedure all sponge, needle and instrument counts were correct.    PLAN OF CARE: Admit for overnight observation  PATIENT DISPOSITION:  PACU - hemodynamically stable.   Delay start of Pharmacological VTE agent (>24hrs) due to surgical blood loss or risk of bleeding:  yes

## 2016-04-22 NOTE — Anesthesia Procedure Notes (Signed)
Procedure Name: Intubation Date/Time: 04/22/2016 9:22 AM Performed by: Rejeana Brock L Pre-anesthesia Checklist: Patient identified, Emergency Drugs available, Suction available and Patient being monitored Patient Re-evaluated:Patient Re-evaluated prior to inductionOxygen Delivery Method: Circle System Utilized Preoxygenation: Pre-oxygenation with 100% oxygen Intubation Type: IV induction Ventilation: Mask ventilation without difficulty Laryngoscope Size: Mac and 4 Grade View: Grade III Tube type: Oral Tube size: 7.5 mm Number of attempts: 1 Airway Equipment and Method: Stylet and Oral airway Placement Confirmation: ETT inserted through vocal cords under direct vision,  positive ETCO2 and breath sounds checked- equal and bilateral Secured at: 23 cm Tube secured with: Tape Dental Injury: Teeth and Oropharynx as per pre-operative assessment  Difficulty Due To: Difficult Airway- due to anterior larynx

## 2016-04-23 ENCOUNTER — Encounter (HOSPITAL_COMMUNITY): Payer: Self-pay | Admitting: Neurological Surgery

## 2016-05-05 ENCOUNTER — Emergency Department (HOSPITAL_COMMUNITY)
Admission: EM | Admit: 2016-05-05 | Discharge: 2016-05-05 | Disposition: A | Discharge status: Home / Self Care | Payer: Medicare Other | Attending: Emergency Medicine | Admitting: Emergency Medicine

## 2016-05-05 ENCOUNTER — Emergency Department (HOSPITAL_COMMUNITY): Payer: Medicare Other

## 2016-05-05 ENCOUNTER — Encounter (HOSPITAL_COMMUNITY): Payer: Self-pay

## 2016-05-05 DIAGNOSIS — I10 Essential (primary) hypertension: Secondary | ICD-10-CM | POA: Diagnosis not present

## 2016-05-05 DIAGNOSIS — Z85038 Personal history of other malignant neoplasm of large intestine: Secondary | ICD-10-CM | POA: Insufficient documentation

## 2016-05-05 DIAGNOSIS — K8020 Calculus of gallbladder without cholecystitis without obstruction: Secondary | ICD-10-CM | POA: Insufficient documentation

## 2016-05-05 DIAGNOSIS — Z87891 Personal history of nicotine dependence: Secondary | ICD-10-CM | POA: Diagnosis not present

## 2016-05-05 DIAGNOSIS — R0602 Shortness of breath: Secondary | ICD-10-CM | POA: Diagnosis not present

## 2016-05-05 DIAGNOSIS — Z7982 Long term (current) use of aspirin: Secondary | ICD-10-CM | POA: Insufficient documentation

## 2016-05-05 DIAGNOSIS — R0981 Nasal congestion: Secondary | ICD-10-CM

## 2016-05-05 LAB — COMPREHENSIVE METABOLIC PANEL
ALT: 26 U/L (ref 17–63)
ANION GAP: 9 (ref 5–15)
AST: 31 U/L (ref 15–41)
Albumin: 3.5 g/dL (ref 3.5–5.0)
Alkaline Phosphatase: 67 U/L (ref 38–126)
BUN: 13 mg/dL (ref 6–20)
CHLORIDE: 105 mmol/L (ref 101–111)
CO2: 28 mmol/L (ref 22–32)
Calcium: 9.6 mg/dL (ref 8.9–10.3)
Creatinine, Ser: 1.23 mg/dL (ref 0.61–1.24)
GFR calc non Af Amer: 57 mL/min — ABNORMAL LOW (ref >60)
Glucose, Bld: 142 mg/dL — ABNORMAL HIGH (ref 65–99)
Potassium: 3.4 mmol/L — ABNORMAL LOW (ref 3.5–5.1)
SODIUM: 142 mmol/L (ref 135–145)
Total Bilirubin: 0.9 mg/dL (ref 0.3–1.2)
Total Protein: 7 g/dL (ref 6.5–8.1)

## 2016-05-05 LAB — CBC WITH DIFFERENTIAL/PLATELET
BASOS PCT: 1 %
Basophils Absolute: 0.1 10*3/uL (ref 0.0–0.1)
EOS ABS: 0.2 10*3/uL (ref 0.0–0.7)
EOS PCT: 3 %
HCT: 42.3 % (ref 39.0–52.0)
Hemoglobin: 14.8 g/dL (ref 13.0–17.0)
LYMPHS ABS: 1.1 10*3/uL (ref 0.7–4.0)
Lymphocytes Relative: 15 %
MCH: 29.8 pg (ref 26.0–34.0)
MCHC: 35 g/dL (ref 30.0–36.0)
MCV: 85.3 fL (ref 78.0–100.0)
MONOS PCT: 5 %
Monocytes Absolute: 0.4 10*3/uL (ref 0.1–1.0)
Neutro Abs: 5.5 10*3/uL (ref 1.7–7.7)
Neutrophils Relative %: 76 %
Platelets: 229 10*3/uL (ref 150–400)
RBC: 4.96 MIL/uL (ref 4.22–5.81)
RDW: 13.2 % (ref 11.5–15.5)
WBC: 7.2 10*3/uL (ref 4.0–10.5)

## 2016-05-05 LAB — BRAIN NATRIURETIC PEPTIDE: B Natriuretic Peptide: 61 pg/mL (ref 0.0–100.0)

## 2016-05-05 MED ORDER — IOPAMIDOL (ISOVUE-370) INJECTION 76%
INTRAVENOUS | Status: AC
  Administered 2016-05-05: 100 mL
  Filled 2016-05-05: qty 100

## 2016-05-05 NOTE — ED Triage Notes (Signed)
Pt here with c/o SOB and states he feels as though he is having a panic attack and cant breath. Pt is able to speak in complete sentences, denies any chest pain or any pain. SOB started 2-3 days 'I just cant breathe, I cant get air in, I get panicky."

## 2016-05-05 NOTE — ED Notes (Signed)
Pt reports he had back surgery 2 weeks ago.

## 2016-05-05 NOTE — Discharge Instructions (Signed)
There were no serious problems found today, to be associated with your discomfort.  For the ongoing nasal congestion, continue using Flonase nasal spray, and add Afrin nasal spray 1 puff each nostril twice a day for 4 days.  Also try taking Claritin once a day for 2 weeks.  Follow-up with your primary care doctor for a checkup in 1-2 weeks.

## 2016-05-05 NOTE — ED Provider Notes (Signed)
[facility] DEPT Provider Note   CSN: [id number] Arrival date & time: 05/05/16  0240     History   Chief Complaint Chief Complaint  Patient presents with  . Shortness of Breath    HPI Steven Holloway is a 71 y.o. male.  He presents for evaluation of shortness of breath, persistent for several weeks and worsening, and associated with "feel like a panic attack".  Is also having ongoing nasal drainage, despite treating with nasal inhalers.  He feels like he has to breathe through his mouth which makes his mouth dry, but does not help his breathing.  He denies chest pain, weakness or dizziness.  He had lumbar surgery, for degenerative changes, 2 weeks ago.  He is walking somewhat better but still not ambulating easily.  He denies leg swelling or pain.  He has had some periods of feeling hot but no documented fever.  He denies nausea,  vomiting, change in bowel or urinary habits.  There are no other known modifying factors.  HPI  Past Medical History:  Diagnosis Date  . Allergy    sulfa and seasonal   . Arthritis   . Colon cancer ([place])   . Hx of adenomatous colonic polyps 05/27/2014  . Hypertension   . Seasonal allergies     Patient Active Problem List   Diagnosis Date Noted  . S/P lumbar laminectomy 04/22/2016  . Hx of adenomatous colonic polyps 05/27/2014    Past Surgical History:  Procedure Laterality Date  . APPENDECTOMY  1980  . COLONOSCOPY    . LUMBAR LAMINECTOMY/DECOMPRESSION MICRODISCECTOMY N/A 04/22/2016   Procedure: Laminectomy and Foraminotomy - Lumbar Two-Three,Lumbar Three-Four,Lumbar Four-Five with sublaminar decompression;  Surgeon: Eustace Moore, MD;  Location: [facility];  Service: Neurosurgery;  Laterality: N/A;       Home Medications    Prior to Admission medications   Medication Sig Start Date End Date Taking? Authorizing Provider  aspirin EC 81 MG tablet Take 81 mg by mouth at bedtime.    Yes Historical Provider, MD  CRANBERRY PO Take 1,500 mg by mouth  daily.   Yes Historical Provider, MD  docusate sodium (COLACE) 100 MG capsule Take 200 mg by mouth daily.   Yes Historical Provider, MD  fluticasone (FLONASE) 50 MCG/ACT nasal spray Place 1 spray into both nostrils at bedtime.   Yes Historical Provider, MD  losartan-hydrochlorothiazide (HYZAAR) 100-25 MG per tablet Take 0.5 tablets by mouth 2 (two) times daily.   Yes Historical Provider, MD  Multiple Vitamin (MULTIVITAMIN WITH MINERALS) TABS tablet Take 1 tablet by mouth daily.   Yes Historical Provider, MD  Omega-3 Fatty Acids (FISH OIL) 1200 MG CAPS Take 1,200 mg by mouth daily.   Yes Historical Provider, MD  pravastatin (PRAVACHOL) 20 MG tablet Take 20 mg by mouth at bedtime. 01/22/16  Yes Historical Provider, MD  azithromycin (ZITHROMAX Z-PAK) 250 MG tablet Take 2 pills today, then 1 pill daily until gone. Patient not taking: Reported on 04/12/2016 02/15/16   Melony Overly, MD  benzonatate (TESSALON) 100 MG capsule Take 1 capsule (100 mg total) by mouth every 8 (eight) hours. Patient not taking: Reported on 04/12/2016 02/15/16   Melony Overly, MD  HYDROcodone-acetaminophen (NORCO) 7.5-325 MG tablet Take 1 tablet by mouth every 6 (six) hours. Patient not taking: Reported on 05/05/2016 04/22/16   Eustace Moore, MD  methocarbamol (ROBAXIN) 500 MG tablet Take 1 tablet (500 mg total) by mouth every 6 (six) hours as needed for muscle spasms. Patient  not taking: Reported on 05/05/2016 04/22/16   Eustace Moore, MD  predniSONE (DELTASONE) 50 MG tablet Take 1 pill daily for 5 days. Patient not taking: Reported on 04/12/2016 02/15/16   Melony Overly, MD    Family History Family History  Problem Relation Age of Onset  . Colon cancer Neg Hx   . Esophageal cancer Neg Hx   . Rectal cancer Neg Hx   . Stomach cancer Neg Hx     Social History Social History  Substance Use Topics  . Smoking status: Former Research scientist (life sciences)  . Smokeless tobacco: Never Used  . Alcohol use Yes     Comment: occasional     Allergies     Sulfa antibiotics   Review of Systems Review of Systems  All other systems reviewed and are negative.    Physical Exam Updated Vital Signs BP 133/71   Pulse 75   Temp 97.4 F (36.3 C) (Oral)   Resp 15   Ht 5\' 7"  (1.702 m)   Wt 215 lb (97.5 kg)   SpO2 99%   BMI 33.67 kg/m   Physical Exam   ED Treatments / Results  Labs (all labs ordered are listed, but only abnormal results are displayed) Labs Reviewed  COMPREHENSIVE METABOLIC PANEL - Abnormal; Notable for the following:       Result Value   Potassium 3.4 (*)    Glucose, Bld 142 (*)    GFR calc non Af Amer 57 (*)    All other components within normal limits  CBC WITH DIFFERENTIAL/PLATELET  BRAIN NATRIURETIC PEPTIDE    EKG  EKG Interpretation  Date/Time:  Wednesday May 05 2016 09:28:47 EDT Ventricular Rate:  76 PR Interval:  148 QRS Duration: 80 QT Interval:  378 QTC Calculation: 425 R Axis:   1 Text Interpretation:  Normal sinus rhythm Possible Inferior infarct , age undetermined Abnormal ECG since last tracing no significant change Confirmed by Eulis Foster  MD, Martika Egler ([id number]) on 05/05/2016 12:36:21 PM       Radiology No results found.  Procedures Procedures (including critical care time)  Medications Ordered in ED Medications  iopamidol (ISOVUE-370) 76 % injection (100 mLs  Contrast Given 05/05/16 1305)     Initial Impression / Assessment and Plan / ED Course  I have reviewed the triage vital signs and the nursing notes.  Pertinent labs & imaging results that were available during my care of the patient were reviewed by me and considered in my medical decision making (see chart for details).      IMPRESSION: CT angiogram chest No evidence of pulmonary emboli.  No focal infiltrate is seen.  Cholelithiasis without complicating factors.   Electronically Signed   By: Inez Catalina M.D.   On: 05/05/2016 13:26    Final Clinical Impressions(s) / ED Diagnoses   Final diagnoses:  Nasal  congestion  SOB (shortness of breath)   Nonspecific nasal congestion with shortness of breath.  No evidence for pneumonia, PE or ACS.  Doubt CHF  Nursing Notes Reviewed/ Care Coordinated Applicable Imaging Reviewed Interpretation of Laboratory Data incorporated into ED treatment  The patient appears reasonably screened and/or stabilized for discharge and I doubt any other medical condition or other [facility] requiring further screening, evaluation, or treatment in the ED at this time prior to discharge.  Plan: Home Medications-OTC; Home Treatments-rest, fluids; return here if the recommended treatment, does not improve the symptoms; Recommended follow up-PCP 1 week and as needed    New Prescriptions Discharge Medication  List as of 05/05/2016  2:36 PM       Daleen Bo, MD 05/08/16 2213

## 2016-05-14 DIAGNOSIS — J342 Deviated nasal septum: Secondary | ICD-10-CM | POA: Insufficient documentation

## 2016-05-14 DIAGNOSIS — J343 Hypertrophy of nasal turbinates: Secondary | ICD-10-CM | POA: Insufficient documentation

## 2016-05-14 DIAGNOSIS — J310 Chronic rhinitis: Secondary | ICD-10-CM | POA: Insufficient documentation

## 2017-01-25 HISTORY — PX: COLONOSCOPY: SHX174

## 2017-05-19 ENCOUNTER — Encounter: Payer: Self-pay | Admitting: Internal Medicine

## 2017-05-23 ENCOUNTER — Encounter: Payer: Self-pay | Admitting: Internal Medicine

## 2017-05-25 ENCOUNTER — Other Ambulatory Visit: Payer: Self-pay

## 2017-05-25 ENCOUNTER — Ambulatory Visit (AMBULATORY_SURGERY_CENTER): Payer: Self-pay | Admitting: *Deleted

## 2017-05-25 VITALS — Ht 67.0 in | Wt 218.0 lb

## 2017-05-25 DIAGNOSIS — Z86010 Personal history of colonic polyps: Secondary | ICD-10-CM

## 2017-05-25 NOTE — Progress Notes (Signed)
Patient denies any allergies to eggs or soy. Patient denies any problems with anesthesia/sedation. Patient denies any oxygen use at home. Patient denies taking any diet/weight loss medications or blood thinners. EMMI education declined by the pt.  

## 2017-05-27 ENCOUNTER — Encounter: Payer: Self-pay | Admitting: Internal Medicine

## 2017-06-07 ENCOUNTER — Encounter: Payer: Self-pay | Admitting: Internal Medicine

## 2017-06-07 ENCOUNTER — Other Ambulatory Visit: Payer: Self-pay

## 2017-06-07 ENCOUNTER — Ambulatory Visit (AMBULATORY_SURGERY_CENTER): Payer: Medicare Other | Admitting: Internal Medicine

## 2017-06-07 VITALS — BP 113/50 | HR 57 | Temp 98.0°F | Resp 11 | Ht 67.0 in | Wt 218.0 lb

## 2017-06-07 DIAGNOSIS — D123 Benign neoplasm of transverse colon: Secondary | ICD-10-CM

## 2017-06-07 DIAGNOSIS — D124 Benign neoplasm of descending colon: Secondary | ICD-10-CM

## 2017-06-07 DIAGNOSIS — D125 Benign neoplasm of sigmoid colon: Secondary | ICD-10-CM | POA: Diagnosis not present

## 2017-06-07 DIAGNOSIS — D122 Benign neoplasm of ascending colon: Secondary | ICD-10-CM | POA: Diagnosis not present

## 2017-06-07 DIAGNOSIS — Z86010 Personal history of colonic polyps: Secondary | ICD-10-CM | POA: Diagnosis present

## 2017-06-07 MED ORDER — SODIUM CHLORIDE 0.9 % IV SOLN: 500.0000 mL | Freq: Once | INTRAVENOUS | Status: DC

## 2017-06-07 NOTE — Progress Notes (Signed)
Report to PACU, RN, vss, BBS= Clear.  

## 2017-06-07 NOTE — Progress Notes (Signed)
Called to room to assist during endoscopic procedure.  Patient ID and intended procedure confirmed with present staff. Received instructions for my participation in the procedure from the performing physician.  

## 2017-06-07 NOTE — Progress Notes (Signed)
Pt's states no medical or surgical changes since previsit or office visit. 

## 2017-06-07 NOTE — Op Note (Signed)
[facility] Patient Name: Steven Holloway Procedure Date: 06/07/2017 11:06 AM MRN: [id number] Endoscopist: Gatha Mayer , MD Age: 72 Referring MD:  Date of Birth: 03/09/1945 Gender: Male Account #: [id number] Procedure:                Colonoscopy Indications:              Surveillance: Personal history of adenomatous                            polyps on last colonoscopy 3 years ago Medicines:                Propofol per Anesthesia, Monitored Anesthesia Care Procedure:                Pre-Anesthesia Assessment:                           - Prior to the procedure, a History and Physical                            was performed, and patient medications and                            allergies were reviewed. The patient's tolerance of                            previous anesthesia was also reviewed. The risks                            and benefits of the procedure and the sedation                            options and risks were discussed with the patient.                            All questions were answered, and informed consent                            was obtained. Prior Anticoagulants: The patient has                            taken no previous anticoagulant or antiplatelet                            agents. ASA Grade Assessment: II - A patient with                            mild systemic disease. After reviewing the risks                            and benefits, the patient was deemed in                            satisfactory condition to undergo the procedure.  After obtaining informed consent, the colonoscope                            was passed under direct vision. Throughout the                            procedure, the patient's blood pressure, pulse, and                            oxygen saturations were monitored continuously. The                            Colonoscope was introduced through the anus and   advanced to the the cecum, identified by                            appendiceal orifice and ileocecal valve. The                            colonoscopy was performed without difficulty. The                            patient tolerated the procedure well. The quality                            of the bowel preparation was good. The ileocecal                            valve, appendiceal orifice, and rectum were                            photographed. The bowel preparation used was                            Miralax. Scope In: 11:20:43 AM Scope Out: 11:40:41 AM Scope Withdrawal Time: 0 hours 17 minutes 33 seconds  Total Procedure Duration: 0 hours 19 minutes 58 seconds  Findings:                 The perianal and digital rectal examinations were                            normal. Pertinent negatives include normal prostate                            (size, shape, and consistency).                           Five sessile polyps were found in the sigmoid                            colon, descending colon, transverse colon and                            ascending colon. The polyps were 3 to 10 mm in  size. These polyps were removed with a cold snare.                            Resection and retrieval were complete. Verification                            of patient identification for the specimen was                            done. Estimated blood loss was minimal.                           The exam was otherwise without abnormality on                            direct and retroflexion views. Complications:            No immediate complications. Estimated Blood Loss:     Estimated blood loss was minimal. Impression:               - Five 3 to 10 mm polyps in the sigmoid colon, in                            the descending colon, in the transverse colon and                            in the ascending colon, removed with a cold snare.                            Resected  and retrieved.                           - The examination was otherwise normal on direct                            and retroflexion views.                           - Personal history of colonic polyps. 2 adenomas                            max 10 mm 2016 Recommendation:           - Patient has a contact number available for                            emergencies. The signs and symptoms of potential                            delayed complications were discussed with the                            patient. Return to normal activities tomorrow.  Written discharge instructions were provided to the                            patient.                           - Resume previous diet.                           - Continue present medications.                           - Repeat colonoscopy is recommended for                            surveillance. The colonoscopy date will be                            determined after pathology results from today's                            exam become available for review. Gatha Mayer, MD 06/07/2017 11:47:16 AM This report has been signed electronically.

## 2017-06-07 NOTE — Patient Instructions (Addendum)
I found and removed 5 small polyps today.  I will let you know pathology results and when to have another routine colonoscopy by mail and/or My Chart.  I appreciate the opportunity to care for you. Gatha Mayer, MD, FACG  YOU HAD AN ENDOSCOPIC PROCEDURE TODAY AT [facility]:   Refer to the procedure report that was given to you for any specific questions about what was found during the examination.  If the procedure report does not answer your questions, please call your gastroenterologist to clarify.  If you requested that your care partner not be given the details of your procedure findings, then the procedure report has been included in a sealed envelope for you to review at your convenience later.  YOU SHOULD EXPECT: Some feelings of bloating in the abdomen. Passage of more gas than usual.  Walking can help get rid of the air that was put into your GI tract during the procedure and reduce the bloating. If you had a lower endoscopy (such as a colonoscopy or flexible sigmoidoscopy) you may notice spotting of blood in your stool or on the toilet paper. If you underwent a bowel prep for your procedure, you may not have a normal bowel movement for a few days.  Please Note:  You might notice some irritation and congestion in your nose or some drainage.  This is from the oxygen used during your procedure.  There is no need for concern and it should clear up in a day or so.  SYMPTOMS TO REPORT IMMEDIATELY:   Following lower endoscopy (colonoscopy or flexible sigmoidoscopy):  Excessive amounts of blood in the stool  Significant tenderness or worsening of abdominal pains  Swelling of the abdomen that is new, acute  Fever of 100F or higher   Following upper endoscopy (EGD)  Vomiting of blood or coffee ground material  New chest pain or pain under the shoulder blades  Painful or persistently difficult swallowing  New shortness of breath  Fever of 100F or  higher  Black, tarry-looking stools  For urgent or emergent issues, a gastroenterologist can be reached at any hour by calling [phone].   DIET:  We do recommend a small meal at first, but then you may proceed to your regular diet.  Drink plenty of fluids but you should avoid alcoholic beverages for 24 hours.  ACTIVITY:  You should plan to take it easy for the rest of today and you should NOT DRIVE or use heavy machinery until tomorrow (because of the sedation medicines used during the test).    FOLLOW UP: Our staff will call the number listed on your records the next business day following your procedure to check on you and address any questions or concerns that you may have regarding the information given to you following your procedure. If we do not reach you, we will leave a message.  However, if you are feeling well and you are not experiencing any problems, there is no need to return our call.  We will assume that you have returned to your regular daily activities without incident.  If any biopsies were taken you will be contacted by phone or by letter within the next 1-3 weeks.  Please call us at [phone] if you have not heard about the biopsies in 3 weeks.    SIGNATURES/CONFIDENTIALITY: You and/or your care partner have signed paperwork which will be entered into your electronic medical record.  These signatures attest to the fact  that that the information above on your After Visit Summary has been reviewed and is understood.  Full responsibility of the confidentiality of this discharge information lies with you and/or your care-partner.   Polyp information given.

## 2017-06-08 ENCOUNTER — Telehealth: Payer: Self-pay

## 2017-06-08 NOTE — Telephone Encounter (Signed)
  Follow up Call-  Call back number 06/07/2017  Post procedure Call Back phone  # [phone]  Permission to leave phone message Yes  Some recent data might be hidden     Patient questions:  Do you have a fever, pain , or abdominal swelling? No. Pain Score  0 *  Have you tolerated food without any problems? Yes.    Have you been able to return to your normal activities? Yes.    Do you have any questions about your discharge instructions: Diet   No. Medications  No. Follow up visit  No.  Do you have questions or concerns about your Care? No.  Actions: * If pain score is 4 or above: No action needed, pain <4.

## 2017-06-16 ENCOUNTER — Encounter: Payer: Self-pay | Admitting: Internal Medicine

## 2017-06-16 DIAGNOSIS — Z86010 Personal history of colonic polyps: Secondary | ICD-10-CM

## 2017-06-16 NOTE — Progress Notes (Signed)
3 adenomas and 2 ssa Recall 2022

## 2017-12-24 ENCOUNTER — Emergency Department (HOSPITAL_COMMUNITY): Payer: Medicare Other

## 2017-12-24 ENCOUNTER — Encounter (HOSPITAL_COMMUNITY): Payer: Self-pay | Admitting: Emergency Medicine

## 2017-12-24 ENCOUNTER — Emergency Department (HOSPITAL_COMMUNITY)
Admission: EM | Admit: 2017-12-24 | Discharge: 2017-12-24 | Disposition: A | Discharge status: Home / Self Care | Payer: Medicare Other | Attending: Emergency Medicine | Admitting: Emergency Medicine

## 2017-12-24 DIAGNOSIS — Z7982 Long term (current) use of aspirin: Secondary | ICD-10-CM | POA: Diagnosis not present

## 2017-12-24 DIAGNOSIS — R509 Fever, unspecified: Secondary | ICD-10-CM | POA: Diagnosis present

## 2017-12-24 DIAGNOSIS — Z79899 Other long term (current) drug therapy: Secondary | ICD-10-CM | POA: Diagnosis not present

## 2017-12-24 DIAGNOSIS — N410 Acute prostatitis: Secondary | ICD-10-CM | POA: Insufficient documentation

## 2017-12-24 DIAGNOSIS — I10 Essential (primary) hypertension: Secondary | ICD-10-CM | POA: Insufficient documentation

## 2017-12-24 DIAGNOSIS — Z87891 Personal history of nicotine dependence: Secondary | ICD-10-CM | POA: Insufficient documentation

## 2017-12-24 LAB — PROTIME-INR
INR: 1.24
Prothrombin Time: 15.5 seconds — ABNORMAL HIGH (ref 11.4–15.2)

## 2017-12-24 LAB — COMPREHENSIVE METABOLIC PANEL
ALT: 36 U/L (ref 0–44)
AST: 36 U/L (ref 15–41)
Albumin: 3.3 g/dL — ABNORMAL LOW (ref 3.5–5.0)
Alkaline Phosphatase: 80 U/L (ref 38–126)
Anion gap: 11 (ref 5–15)
BUN: 19 mg/dL (ref 8–23)
CHLORIDE: 98 mmol/L (ref 98–111)
CO2: 28 mmol/L (ref 22–32)
CREATININE: 1.55 mg/dL — AB (ref 0.61–1.24)
Calcium: 8.7 mg/dL — ABNORMAL LOW (ref 8.9–10.3)
GFR calc Af Amer: 51 mL/min — ABNORMAL LOW (ref >60)
GFR calc non Af Amer: 44 mL/min — ABNORMAL LOW (ref >60)
Glucose, Bld: 135 mg/dL — ABNORMAL HIGH (ref 70–99)
Potassium: 3.6 mmol/L (ref 3.5–5.1)
Sodium: 137 mmol/L (ref 135–145)
Total Bilirubin: 1.1 mg/dL (ref 0.3–1.2)
Total Protein: 6.7 g/dL (ref 6.5–8.1)

## 2017-12-24 LAB — CBC WITH DIFFERENTIAL/PLATELET
Abs Immature Granulocytes: 0.06 10*3/uL (ref 0.00–0.07)
Basophils Absolute: 0.1 10*3/uL (ref 0.0–0.1)
Basophils Relative: 1 %
Eosinophils Absolute: 0.1 10*3/uL (ref 0.0–0.5)
Eosinophils Relative: 1 %
HCT: 45.2 % (ref 39.0–52.0)
Hemoglobin: 14.5 g/dL (ref 13.0–17.0)
Immature Granulocytes: 1 %
Lymphocytes Relative: 2 %
Lymphs Abs: 0.3 10*3/uL — ABNORMAL LOW (ref 0.7–4.0)
MCH: 28.8 pg (ref 26.0–34.0)
MCHC: 32.1 g/dL (ref 30.0–36.0)
MCV: 89.7 fL (ref 80.0–100.0)
MONO ABS: 0.8 10*3/uL (ref 0.1–1.0)
Monocytes Relative: 7 %
NEUTROS ABS: 9.7 10*3/uL — AB (ref 1.7–7.7)
Neutrophils Relative %: 88 %
Platelets: 148 10*3/uL — ABNORMAL LOW (ref 150–400)
RBC: 5.04 MIL/uL (ref 4.22–5.81)
RDW: 13 % (ref 11.5–15.5)
WBC: 10.9 10*3/uL — ABNORMAL HIGH (ref 4.0–10.5)
nRBC: 0 % (ref 0.0–0.2)

## 2017-12-24 LAB — URINALYSIS, ROUTINE W REFLEX MICROSCOPIC
Bilirubin Urine: NEGATIVE
Glucose, UA: NEGATIVE mg/dL
HGB URINE DIPSTICK: NEGATIVE
Ketones, ur: NEGATIVE mg/dL
Nitrite: NEGATIVE
Protein, ur: 30 mg/dL — AB
SPECIFIC GRAVITY, URINE: 1.025 (ref 1.005–1.030)
WBC, UA: >50 WBC/hpf — ABNORMAL HIGH (ref 0–5)
pH: 5 (ref 5.0–8.0)

## 2017-12-24 LAB — I-STAT CG4 LACTIC ACID, ED: Lactic Acid, Venous: 1.78 mmol/L (ref 0.5–1.9)

## 2017-12-24 MED ORDER — CIPROFLOXACIN HCL 500 MG PO TABS: 500.0000 mg | ORAL_TABLET | Freq: Two times a day (BID) | ORAL | 0 refills | Status: DC

## 2017-12-24 MED ORDER — IOHEXOL 300 MG/ML  SOLN
75.0000 mL | Freq: Once | INTRAMUSCULAR | Status: AC | PRN
  Administered 2017-12-24: 75 mL via INTRAVENOUS

## 2017-12-24 MED ORDER — SODIUM CHLORIDE 0.9 % IV SOLN
1.0000 g | Freq: Once | INTRAVENOUS | Status: AC
  Administered 2017-12-24: 1 g via INTRAVENOUS
  Filled 2017-12-24: qty 10

## 2017-12-24 NOTE — ED Notes (Signed)
MD at bedside. 

## 2017-12-24 NOTE — ED Notes (Signed)
Pt first lactic was neg EDMD aware

## 2017-12-24 NOTE — ED Notes (Signed)
Patient transported to CT 

## 2017-12-24 NOTE — ED Notes (Signed)
Patient transported to x-ray. ?

## 2017-12-24 NOTE — ED Triage Notes (Addendum)
Patient sent by PCP for WBC 19,000 - seen yesterday for N/V, fever (102F at home), abdominal bloating, and hematuria. He reports burning/pain with urination. Denies cough. Resp e/u. Received IM Rocephin and Cipro yesterday.

## 2017-12-24 NOTE — ED Notes (Signed)
Patient verbalized understanding of discharge instructions and denies any further needs or questions at this time. VS stable. Patient ambulatory with steady gait. Assisted to ED entrance in wheelchair.   

## 2017-12-24 NOTE — ED Provider Notes (Signed)
[facility] EMERGENCY DEPARTMENT Provider Note   CSN: [id number] Arrival date & time: 12/24/17  1003     History   Chief Complaint Chief Complaint  Patient presents with  . Fever    HPI Steven Holloway is a 72 y.o. male.  HPI Patient presents with elevated white count of 19,000.  Has had some nausea vomiting and fever of 102.  Seen by different medical yesterday.  Reportedly had hematuria and when they do the blood work it showed a white count of 19.  Had gotten Rocephin and Cipro to treat UTI yesterday.  Continues to feel bad.  No cough.  Some dull abdominal pain.  Patient was sent in to rule out sepsis and per the patient's wife diverticulitis. Past Medical History:  Diagnosis Date  . Allergy    sulfa and seasonal   . Arthritis   . Hx of adenomatous colonic polyps 05/27/2014  . Hypertension   . Seasonal allergies     Patient Active Problem List   Diagnosis Date Noted  . Deviated septum 05/14/2016  . Nasal turbinate hypertrophy 05/14/2016  . Rhinitis, chronic 05/14/2016  . S/P lumbar laminectomy 04/22/2016  . Hx of adenomatous colonic polyps 05/27/2014    Past Surgical History:  Procedure Laterality Date  . APPENDECTOMY  1980  . COLONOSCOPY    . LUMBAR LAMINECTOMY/DECOMPRESSION MICRODISCECTOMY N/A 04/22/2016   Procedure: Laminectomy and Foraminotomy - Lumbar Two-Three,Lumbar Three-Four,Lumbar Four-Five with sublaminar decompression;  Surgeon: Eustace Moore, MD;  Location: [facility];  Service: Neurosurgery;  Laterality: N/A;        Home Medications    Prior to Admission medications   Medication Sig Start Date End Date Taking? Authorizing Provider  aspirin EC 81 MG tablet Take 81 mg by mouth at bedtime.     [provider]  ciprofloxacin (CIPRO) 500 MG tablet Take 1 tablet (500 mg total) by mouth 2 (two) times daily. 12/24/17   Davonna Belling, MD  CRANBERRY PO Take 1,500 mg by mouth daily.    [provider]  escitalopram  (LEXAPRO) 5 MG tablet Take 5 mg by mouth daily.    [provider]  fluticasone (FLONASE) 50 MCG/ACT nasal spray Place 1 spray into both nostrils at bedtime.    [provider]  losartan-hydrochlorothiazide (HYZAAR) 100-25 MG per tablet Take 0.5 tablets by mouth 2 (two) times daily.    [provider]  pravastatin (PRAVACHOL) 20 MG tablet Take 20 mg by mouth at bedtime. 01/22/16   [provider]    Family History Family History  Problem Relation Age of Onset  . Colon cancer Neg Hx   . Esophageal cancer Neg Hx   . Rectal cancer Neg Hx   . Stomach cancer Neg Hx     Social History Social History   Tobacco Use  . Smoking status: Former Research scientist (life sciences)  . Smokeless tobacco: Never Used  Substance Use Topics  . Alcohol use: Yes    Alcohol/week: 2.0 standard drinks    Types: 2 Cans of beer per week    Comment: occasional  . Drug use: No     Allergies   Sulfa antibiotics and Sulfasalazine   Review of Systems Review of Systems  Constitutional: Positive for appetite change, fatigue and fever.  HENT: Negative for congestion.   Respiratory: Negative for shortness of breath.   Cardiovascular: Negative for chest pain.  Gastrointestinal: Positive for abdominal pain, nausea and vomiting.  Genitourinary: Positive for dysuria.  Musculoskeletal: Positive for  back pain.  Neurological: Negative for weakness.  Psychiatric/Behavioral: Negative for confusion.     Physical Exam Updated Vital Signs BP (!) 114/59 (BP Location: Right Arm)   Pulse 74   Temp 98 F (36.7 C) (Oral)   Resp 16   Ht 5' 7.5" (1.715 m)   Wt 95.3 kg   SpO2 94%   BMI 32.41 kg/m   Physical Exam  Constitutional: He appears well-developed.  HENT:  Head: Normocephalic.  Eyes: Pupils are equal, round, and reactive to light.  Neck: Neck supple.  Cardiovascular: Normal rate.  Pulmonary/Chest: Effort normal.  Abdominal: There is tenderness.  Mild abdominal tenderness without rebound  or guarding peer  Musculoskeletal: He exhibits no edema.  Neurological: He is alert.  Skin: Skin is warm. Capillary refill takes less than 2 seconds.     ED Treatments / Results  Labs (all labs ordered are listed, but only abnormal results are displayed) Labs Reviewed  COMPREHENSIVE METABOLIC PANEL - Abnormal; Notable for the following components:      Result Value   Glucose, Bld 135 (*)    Creatinine, Ser 1.55 (*)    Calcium 8.7 (*)    Albumin 3.3 (*)    GFR calc non Af Amer 44 (*)    GFR calc Af Amer 51 (*)    All other components within normal limits  CBC WITH DIFFERENTIAL/PLATELET - Abnormal; Notable for the following components:   WBC 10.9 (*)    Platelets 148 (*)    Neutro Abs 9.7 (*)    Lymphs Abs 0.3 (*)    All other components within normal limits  PROTIME-INR - Abnormal; Notable for the following components:   Prothrombin Time 15.5 (*)    All other components within normal limits  URINALYSIS, ROUTINE W REFLEX MICROSCOPIC - Abnormal; Notable for the following components:   Color, Urine AMBER (*)    APPearance CLOUDY (*)    Protein, ur 30 (*)    Leukocytes, UA LARGE (*)    WBC, UA >50 (*)    Bacteria, UA RARE (*)    Non Squamous Epithelial 11-20 (*)    All other components within normal limits  CULTURE, BLOOD (ROUTINE X 2)  CULTURE, BLOOD (ROUTINE X 2)  URINE CULTURE  I-STAT CG4 LACTIC ACID, ED    EKG None  Radiology Dg Chest 2 View  Result Date: 12/24/2017 CLINICAL DATA:  Vomiting EXAM: CHEST - 2 VIEW COMPARISON:  05/05/2016 FINDINGS: Normal heart size. Bibasilar subsegmental atelectasis. No pneumothorax. No pleural effusion. IMPRESSION: No active cardiopulmonary disease. Electronically Signed   By: Marybelle Killings M.D.   On: 12/24/2017 10:58   Ct Abdomen Pelvis W Contrast  Result Date: 12/24/2017 CLINICAL DATA:  Back pain.  Nausea.  Vomiting.  Fever for 3 days. EXAM: CT ABDOMEN AND PELVIS WITH CONTRAST TECHNIQUE: Multidetector CT imaging of the abdomen  and pelvis was performed using the standard protocol following bolus administration of intravenous contrast. CONTRAST:  29mL OMNIPAQUE IOHEXOL 300 MG/ML  SOLN COMPARISON:  10/29/2015 FINDINGS: Lower chest: Dependent atelectasis. Hepatobiliary: Unremarkable appearance of the liver. There are 2 lamellated gallstones. Pancreas: Unremarkable Spleen: Unremarkable Adrenals/Urinary Tract: Anterior right renal cyst. Right lower pole renal cyst. Left kidney is unremarkable. Adrenal glands are within normal limits. Bladder is decompressed. Stomach/Bowel: No obvious mass in the colon. No evidence of small-bowel obstruction. Stomach is decompressed. No evidence of acute diverticulitis or diverticulosis in the colon. Vascular/Lymphatic: Aortic atherosclerotic calcification. No abnormal retroperitoneal adenopathy. Reproductive: Prostate enlargement. There is stranding  in the fat surrounding the prostate gland. Other: No free fluid. Musculoskeletal: No vertebral compression deformity. IMPRESSION: Prostate is enlarged with stranding in the adjacent fat. An inflammatory process is not excluded. Cholelithiasis. No evidence of diverticulitis. Electronically Signed   By: Marybelle Killings M.D.   On: 12/24/2017 12:26    Procedures Procedures (including critical care time)  Medications Ordered in ED Medications  iohexol (OMNIPAQUE) 300 MG/ML solution 75 mL (75 mLs Intravenous Contrast Given 12/24/17 1206)  cefTRIAXone (ROCEPHIN) 1 g in sodium chloride 0.9 % 100 mL IVPB (0 g Intravenous Stopped 12/24/17 1429)     Initial Impression / Assessment and Plan / ED Course  I have reviewed the triage vital signs and the nursing notes.  Pertinent labs & imaging results that were available during my care of the patient were reviewed by me and considered in my medical decision making (see chart for details).     Patient with fever.  White count elevated 19 yesterday however down to 10.9 today.  Urinalysis shows likely infection.   However CT scan done and shows prostatitis.  Has been on Cipro for only a day and had a dose of Rocephin yesterday.  With the fever a second dose of Rocephin given.  Will lengthen the course of Cipro but follow-up with PCP.  Patient feels better is asking for orals will be discharged home. Final Clinical Impressions(s) / ED Diagnoses   Final diagnoses:  Acute prostatitis    ED Discharge Orders         Ordered    ciprofloxacin (CIPRO) 500 MG tablet  2 times daily     12/24/17 1453           Davonna Belling, MD 12/24/17 1503

## 2017-12-24 NOTE — ED Notes (Signed)
Pt here for eval of right flank pain with body aches and fever. Pt feels warm to touch, HR 93 O2 sats 93 at check in. States he was seen by PCP and had urinalysis and antibiotic shot. Taking cipro. States the pain has gotten worse.

## 2017-12-24 NOTE — ED Notes (Signed)
Patient back from x-ray 

## 2017-12-25 LAB — URINE CULTURE: Culture: NO GROWTH

## 2017-12-29 LAB — CULTURE, BLOOD (ROUTINE X 2)
Culture: NO GROWTH
Culture: NO GROWTH
Special Requests: ADEQUATE
Special Requests: ADEQUATE

## 2019-09-27 LAB — IFOBT (OCCULT BLOOD): IFOBT: NEGATIVE

## 2019-11-03 ENCOUNTER — Emergency Department (HOSPITAL_BASED_OUTPATIENT_CLINIC_OR_DEPARTMENT_OTHER): Payer: Medicare Other

## 2019-11-03 ENCOUNTER — Emergency Department (HOSPITAL_BASED_OUTPATIENT_CLINIC_OR_DEPARTMENT_OTHER)
Admission: EM | Admit: 2019-11-03 | Discharge: 2019-11-03 | Disposition: A | Discharge status: Home / Self Care | Payer: Medicare Other | Attending: Emergency Medicine | Admitting: Emergency Medicine

## 2019-11-03 ENCOUNTER — Encounter (HOSPITAL_BASED_OUTPATIENT_CLINIC_OR_DEPARTMENT_OTHER): Payer: Self-pay | Admitting: Emergency Medicine

## 2019-11-03 ENCOUNTER — Other Ambulatory Visit: Payer: Self-pay

## 2019-11-03 DIAGNOSIS — Z7982 Long term (current) use of aspirin: Secondary | ICD-10-CM | POA: Diagnosis not present

## 2019-11-03 DIAGNOSIS — J3489 Other specified disorders of nose and nasal sinuses: Secondary | ICD-10-CM | POA: Diagnosis not present

## 2019-11-03 DIAGNOSIS — Z87891 Personal history of nicotine dependence: Secondary | ICD-10-CM | POA: Diagnosis not present

## 2019-11-03 DIAGNOSIS — Z79899 Other long term (current) drug therapy: Secondary | ICD-10-CM | POA: Insufficient documentation

## 2019-11-03 DIAGNOSIS — Z86010 Personal history of colonic polyps: Secondary | ICD-10-CM | POA: Diagnosis not present

## 2019-11-03 DIAGNOSIS — R079 Chest pain, unspecified: Secondary | ICD-10-CM | POA: Diagnosis present

## 2019-11-03 DIAGNOSIS — R059 Cough, unspecified: Secondary | ICD-10-CM | POA: Diagnosis not present

## 2019-11-03 DIAGNOSIS — I10 Essential (primary) hypertension: Secondary | ICD-10-CM | POA: Diagnosis not present

## 2019-11-03 DIAGNOSIS — R0602 Shortness of breath: Secondary | ICD-10-CM | POA: Diagnosis not present

## 2019-11-03 LAB — CBC
HCT: 43.7 % (ref 39.0–52.0)
Hemoglobin: 14.7 g/dL (ref 13.0–17.0)
MCH: 29.7 pg (ref 26.0–34.0)
MCHC: 33.6 g/dL (ref 30.0–36.0)
MCV: 88.3 fL (ref 80.0–100.0)
Platelets: 153 10*3/uL (ref 150–400)
RBC: 4.95 MIL/uL (ref 4.22–5.81)
RDW: 13.3 % (ref 11.5–15.5)
WBC: 5 10*3/uL (ref 4.0–10.5)
nRBC: 0 % (ref 0.0–0.2)

## 2019-11-03 LAB — COMPREHENSIVE METABOLIC PANEL
ALT: 21 U/L (ref 0–44)
AST: 18 U/L (ref 15–41)
Albumin: 3.6 g/dL (ref 3.5–5.0)
Alkaline Phosphatase: 76 U/L (ref 38–126)
Anion gap: 12 (ref 5–15)
BUN: 21 mg/dL (ref 8–23)
CO2: 31 mmol/L (ref 22–32)
Calcium: 9.3 mg/dL (ref 8.9–10.3)
Chloride: 100 mmol/L (ref 98–111)
Creatinine, Ser: 0.99 mg/dL (ref 0.61–1.24)
GFR, Estimated: >60 mL/min (ref >60)
Glucose, Bld: 96 mg/dL (ref 70–99)
Potassium: 4.3 mmol/L (ref 3.5–5.1)
Sodium: 143 mmol/L (ref 135–145)
Total Bilirubin: 0.8 mg/dL (ref 0.3–1.2)
Total Protein: 6.6 g/dL (ref 6.5–8.1)

## 2019-11-03 LAB — TROPONIN I (HIGH SENSITIVITY)
Troponin I (High Sensitivity): 5 ng/L (ref <18)
Troponin I (High Sensitivity): 6 ng/L (ref <18)

## 2019-11-03 LAB — D-DIMER, QUANTITATIVE: D-Dimer, Quant: 0.29 ug/mL-FEU (ref 0.00–0.50)

## 2019-11-03 NOTE — ED Triage Notes (Addendum)
Pt arrives pov with spouse with c/o left side CP, radiation to back and L arm x 1 week. Pt endorses nausea, shob, denies emesis

## 2019-11-03 NOTE — ED Provider Notes (Signed)
[facility] EMERGENCY DEPARTMENT Provider Note   CSN: [id number] Arrival date & time: 11/03/19  0845     History Chief Complaint  Patient presents with  . Chest Pain    Darrik Richman Pryor is a 74 y.o. male.  74yo M w/ PMH including HTN,  HLD who p/w chest pain. PT reports intermittent L sided, chest pain for the past 1 week that occurs randomly for 15-30 min at a time, not associated w/ exertion. Pain sometimes radiates to L back and L shoulder. He reports mild associated SOB, no N/V or diaphoresis. He reports cough and nasal drainage for "a long time" for which his doctor put him on a medication that has been helping. No fevers, leg swelling/pain, history of blood clots, or long travel. Nothing seems to bring pain on. He takes tums which does seem to help. He reports no problems with exercise/exertional activities.   The history is provided by the patient.  Chest Pain      Past Medical History:  Diagnosis Date  . Allergy    sulfa and seasonal   . Arthritis   . Hx of adenomatous colonic polyps 05/27/2014  . Hypertension   . Seasonal allergies     Patient Active Problem List   Diagnosis Date Noted  . Deviated septum 05/14/2016  . Nasal turbinate hypertrophy 05/14/2016  . Rhinitis, chronic 05/14/2016  . S/P lumbar laminectomy 04/22/2016  . Hx of adenomatous colonic polyps 05/27/2014    Past Surgical History:  Procedure Laterality Date  . APPENDECTOMY  1980  . COLONOSCOPY    . LUMBAR LAMINECTOMY/DECOMPRESSION MICRODISCECTOMY N/A 04/22/2016   Procedure: Laminectomy and Foraminotomy - Lumbar Two-Three,Lumbar Three-Four,Lumbar Four-Five with sublaminar decompression;  Surgeon: Eustace Moore, MD;  Location: [facility];  Service: Neurosurgery;  Laterality: N/A;       Family History  Problem Relation Age of Onset  . Colon cancer Neg Hx   . Esophageal cancer Neg Hx   . Rectal cancer Neg Hx   . Stomach cancer Neg Hx     Social History   Tobacco Use  . Smoking  status: Former Research scientist (life sciences)  . Smokeless tobacco: Never Used  Vaping Use  . Vaping Use: Never used  Substance Use Topics  . Alcohol use: Yes    Alcohol/week: 2.0 standard drinks    Types: 2 Cans of beer per week    Comment: occasional  . Drug use: No    Home Medications Prior to Admission medications   Medication Sig Start Date End Date Taking? Authorizing Provider  aspirin EC 81 MG tablet Take 81 mg by mouth at bedtime.     [provider]  ciprofloxacin (CIPRO) 500 MG tablet Take 1 tablet (500 mg total) by mouth 2 (two) times daily. 12/24/17   Davonna Belling, MD  CRANBERRY PO Take 1,500 mg by mouth daily.    [provider]  escitalopram (LEXAPRO) 5 MG tablet Take 5 mg by mouth daily.    [provider]  fluticasone (FLONASE) 50 MCG/ACT nasal spray Place 1 spray into both nostrils at bedtime.    [provider]  losartan-hydrochlorothiazide (HYZAAR) 100-25 MG per tablet Take 0.5 tablets by mouth 2 (two) times daily.    [provider]  pravastatin (PRAVACHOL) 20 MG tablet Take 20 mg by mouth at bedtime. 01/22/16   [provider]    Allergies    Sulfa antibiotics and Sulfasalazine  Review of Systems   Review of Systems  Cardiovascular: Positive for  chest pain.  All other systems reviewed and are negative except that which was mentioned in HPI   Physical Exam Updated Vital Signs BP (!) 154/70   Pulse (!) 53   Temp 98 F (36.7 C) (Oral)   Resp 14   Ht 5\' 8"  (1.727 m)   Wt 93.4 kg   SpO2 99%   BMI 31.32 kg/m   Physical Exam Vitals and nursing note reviewed.  Constitutional:      General: He is not in acute distress.    Appearance: He is well-developed.  HENT:     Head: Normocephalic and atraumatic.  Eyes:     Conjunctiva/sclera: Conjunctivae normal.  Cardiovascular:     Rate and Rhythm: Normal rate and regular rhythm.     Heart sounds: Normal heart sounds. No murmur heard.   Pulmonary:     Effort: Pulmonary  effort is normal.     Breath sounds: Normal breath sounds.  Abdominal:     General: Bowel sounds are normal. There is no distension.     Palpations: Abdomen is soft.     Tenderness: There is no abdominal tenderness.  Musculoskeletal:     Cervical back: Neck supple.     Right lower leg: No tenderness. No edema.     Left lower leg: No tenderness. No edema.  Skin:    General: Skin is warm and dry.  Neurological:     Mental Status: He is alert and oriented to person, place, and time.     Comments: Fluent speech  Psychiatric:        Mood and Affect: Mood normal.        Judgment: Judgment normal.     ED Results / Procedures / Treatments   Labs (all labs ordered are listed, but only abnormal results are displayed) Labs Reviewed  COMPREHENSIVE METABOLIC PANEL  CBC  D-DIMER, QUANTITATIVE (NOT AT [facility])  TROPONIN I (HIGH SENSITIVITY)  TROPONIN I (HIGH SENSITIVITY)    EKG EKG Interpretation  Date/Time:  Saturday November 03 2019 08:52:29 EDT Ventricular Rate:  56 PR Interval:    QRS Duration: 96 QT Interval:  401 QTC Calculation: 387 R Axis:   1 Text Interpretation: Sinus rhythm Low voltage, precordial leads Abnormal R-wave progression, early transition Abnormal inferior Q waves Baseline wander in lead(s) II V2 similar to previous Confirmed by Theotis Burrow [phone]) on 11/03/2019 9:14:07 AM   Radiology DG Chest 2 View  Result Date: 11/03/2019 CLINICAL DATA:  Left-sided chest pain. Ex-smoker, quitting 40 years ago. EXAM: CHEST - 2 VIEW COMPARISON:  12/24/2017 FINDINGS: Numerous leads and wires project over the chest. Midline trachea. Normal heart size and mediastinal contours. No pleural effusion or pneumothorax. Mild bibasilar scarring. IMPRESSION: No acute cardiopulmonary disease. Electronically Signed   By: Abigail Miyamoto M.D.   On: 11/03/2019 10:35    Procedures Procedures (including critical care time)  Medications Ordered in ED Medications - No data to display  ED Course   I have reviewed the triage vital signs and the nursing notes.  Pertinent labs & imaging results that were available during my care of the patient were reviewed by me and considered in my medical decision making (see chart for details).    MDM Rules/Calculators/A&P                          Pt w/ 1 week intermittent, non-exertional chest pain, sometimes improved after taking Tums.  Well-appearing on exam, mildly hypertensive.  EKG  unchanged from previous.  Chest x-ray clear with no evidence of cardiomegaly or infiltrate.  Lab work shows normal CBC and BMP, negative serial troponins, D-dimer negative making PE very unlikely. Based on age and medical problems, the patient's HEART score is technically 4 although I feel that his overall description of pain sounds less likely to be cardiac. Discussed w/ cardiologist on call, Dr. Acie Fredrickson. He recommended ASA 81mg  daily (pt does already take this), NTG as needed, better BP control, and f/u in cardiology clinic  This week. I feel this is a reasonable plan because the patient would be unlikely to receive stress testing if admitted since it is a weekend. Pt reports his PCP took him off Hyzaar and switched to amlodipine and since then, BP has been higher. I recommended calling PCP on Monday to discuss BP management. PCP has recently started him on acid reducing medication which I recommended continuing.  I have extensively reviewed return precautions with the patient and his wife including any worsening symptoms or new symptoms such as exertional pain.  They voiced understanding. Final Clinical Impression(s) / ED Diagnoses Final diagnoses:  Nonspecific chest pain    Rx / DC Orders ED Discharge Orders    None       Reyanna Baley, Wenda Overland, MD 11/03/19 1302

## 2019-11-05 ENCOUNTER — Telehealth: Payer: Self-pay | Admitting: *Deleted

## 2019-11-05 NOTE — Telephone Encounter (Signed)
-----   Message from Marguerita Merles, RN sent at 11/05/2019 10:57 AM EDT -----  ----- Message ----- From: Marguerita Merles, RN Sent: 11/05/2019   9:35 AM EDT To: Michae Kava, CMA   ----- Message ----- From: Thayer Headings, MD Sent: 11/03/2019  12:33 PM EDT To: Rebeca Alert [facility] Triage  Hi Triage,  Mr. Steven Holloway is a 74 yo who was seen in [facility] with some cp for the past month. I had the ER doctor add NTG and amlopidine 2.5 mg a day   He will need to be see this week by any MD or APP as a new patient evaluation for this chest pain .  Thanks.    Mertie Moores, MD  11/03/2019 12:34 PM    [place],  [place], [place]  [address] Phone: [phone]; Fax: [phone]

## 2019-11-05 NOTE — Telephone Encounter (Signed)
Pt has a new pt appt to see Dr. Claiborne Billings at our [place] office for tomorrow 10/12 at 10 am.  Appt was made by our operator/scheduling dept.  This was advised by Dr. Acie Fredrickson, as indicated in this note.

## 2019-11-06 ENCOUNTER — Other Ambulatory Visit: Payer: Self-pay

## 2019-11-06 ENCOUNTER — Encounter: Payer: Self-pay | Admitting: Cardiovascular Disease

## 2019-11-06 ENCOUNTER — Ambulatory Visit: Payer: Medicare Other | Admitting: Cardiovascular Disease

## 2019-11-06 DIAGNOSIS — Z5181 Encounter for therapeutic drug level monitoring: Secondary | ICD-10-CM

## 2019-11-06 DIAGNOSIS — I10 Essential (primary) hypertension: Secondary | ICD-10-CM

## 2019-11-06 DIAGNOSIS — E7800 Pure hypercholesterolemia, unspecified: Secondary | ICD-10-CM | POA: Diagnosis not present

## 2019-11-06 DIAGNOSIS — R0789 Other chest pain: Secondary | ICD-10-CM | POA: Diagnosis not present

## 2019-11-06 DIAGNOSIS — R011 Cardiac murmur, unspecified: Secondary | ICD-10-CM

## 2019-11-06 MED ORDER — PRAVASTATIN SODIUM 40 MG PO TABS: 40.0000 mg | ORAL_TABLET | Freq: Every day | ORAL | 1 refills | Status: DC

## 2019-11-06 MED ORDER — METOPROLOL SUCCINATE ER 25 MG PO TB24: 25.0000 mg | ORAL_TABLET | Freq: Every day | ORAL | 1 refills | Status: DC

## 2019-11-06 NOTE — Patient Instructions (Addendum)
Medication Instructions:  INCREASE YOUR PRAVASTATIN TO 40 MG DAILY   START METOPROLOL SUC 25 MG DAILY   *If you need a refill on your cardiac medications before your next appointment, please call your pharmacy*   Lab Work: BMET 1 WEEK PRIOR TO CT   FASTING LP/CMET FEW DAYS PRIOR TO YOU FOLLOW UP   If you have labs (blood work) drawn today and your tests are completely normal, you will receive your results only by: Marland Kitchen MyChart Message (if you have MyChart) OR . A paper copy in the mail If you have any lab test that is abnormal or we need to change your treatment, we will call you to review the results.  Testing/Procedures: Your physician has requested that you have an echocardiogram. Echocardiography is a painless test that uses sound waves to create images of your heart. It provides your doctor with information about the size and shape of your heart and how well your heart's chambers and valves are working. This procedure takes approximately one hour. There are no restrictions for this procedure. [address]  Your physician has requested that you have cardiac CT. Cardiac computed tomography (CT) is a painless test that uses an x-ray machine to take clear, detailed pictures of your heart. For further information please visit [email or web address]. Please follow instruction sheet as given. ONCE INSURANCE HAS BEEN REVIEWED THE OFFICE WILL CALL YOU TO SCHEDULE. IF YOU DO NOT HEAR FROM THE OFFICE IN 2 WEEKS CALL TO FOLLOW UP   Follow-Up: At [facility], you and your health needs are our priority.  As part of our continuing mission to provide you with exceptional heart care, we have created designated Provider Care Teams.  These Care Teams include your primary Cardiologist (physician) and Advanced Practice Providers (APPs -  Physician Assistants and Nurse Practitioners) who all work together to provide you with the care you need, when you need it.  We recommend  signing up for the patient portal called "MyChart".  Sign up information is provided on this After Visit Summary.  MyChart is used to connect with patients for Virtual Visits (Telemedicine).  Patients are able to view lab/test results, encounter notes, upcoming appointments, etc.  Non-urgent messages can be sent to your provider as well.   To learn more about what you can do with MyChart, go to [email or web address].    Your next appointment:   6-8  week(s)  The format for your next appointment:   In Person  Provider:   You may see DR Claiborne Billings  or one of the following Advanced Practice Providers on your designated Care Team:    Almyra Deforest, PA-C  Fabian Sharp, PA-C or   Roby Lofts, [place]  Other Instructions  Your cardiac CT will be scheduled at one of the below locations:   [facility] [address] [place], [place] [address] [phone]  [place] [address] [place], [place] [address] [phone]  If scheduled at [facility], please arrive at the [facility] main entrance of [facility] 30 minutes prior to test start time. Proceed to the [facility] Radiology Department (first floor) to check-in and test prep.  If scheduled at [facility], please arrive 15 mins early for check-in and test prep.  Please follow these instructions carefully (unless otherwise directed):  Hold all erectile dysfunction medications at least 3 days (72 hrs) prior to test.  On the Night Before the Test: . Be sure to Drink plenty of water. . Do not consume any caffeinated/decaffeinated beverages or chocolate 12 hours prior to your test. . Do not take any antihistamines 12 hours prior to your test. . If the patient has contrast allergy: ? Patient will need a prescription for Prednisone and very clear instructions (as follows): 1. Prednisone 50 mg - take 13 hours prior to test 2. Take  another Prednisone 50 mg 7 hours prior to test 3. Take another Prednisone 50 mg 1 hour prior to test 4. Take Benadryl 50 mg 1 hour prior to test . Patient must complete all four doses of above prophylactic medications. . Patient will need a ride after test due to Benadryl.  On the Day of the Test: . Drink plenty of water. Do not drink any water within one hour of the test. . Do not eat any food 4 hours prior to the test. . You may take your regular medications prior to the test.  . Take metoprolol (Lopressor) two hours prior to test. . HOLD Furosemide/Hydrochlorothiazide morning of the test. . FEMALES- please wear underwire-free bra if available      After the Test: . Drink plenty of water. . After receiving IV contrast, you may experience a mild flushed feeling. This is normal. . On occasion, you may experience a mild rash up to 24 hours after the test. This is not dangerous. If this occurs, you can take Benadryl 25 mg and increase your fluid intake. . If you experience trouble breathing, this can be serious. If it is severe call 911 IMMEDIATELY. If it is mild, please call our office. . If you take any of these medications: Glipizide/Metformin, Avandament, Glucavance, please do not take 48 hours after completing test unless otherwise instructed.   Once we have confirmed authorization from your insurance company, we will call you to set up a date and time for your test. Based on how quickly your insurance processes prior authorizations requests, please allow up to 4 weeks to be contacted for scheduling your Cardiac CT appointment. Be advised that routine Cardiac CT appointments could be scheduled as many as 8 weeks after your provider has ordered it.  For non-scheduling related questions, please contact the cardiac imaging nurse navigator should you have any questions/concerns: Marchia Bond, Cardiac Imaging Nurse Navigator Burley Saver, Interim Cardiac Imaging Nurse [place]  [facility] Services Direct Office Dial: [phone]   For scheduling needs, including cancellations and rescheduling, please call Vivien Rota at [phone], option 3.   Cardiac CT Angiogram A cardiac CT angiogram is a procedure to look at the heart and the area around the heart. It may be done to help find the cause of chest pains or other symptoms of heart disease. During this procedure, a substance called contrast dye is injected into the blood vessels in the area to be checked. A large X-ray machine, called a CT scanner, then takes detailed pictures of the heart and the surrounding area. The procedure is also sometimes called a coronary CT angiogram, coronary artery scanning, or CTA. A cardiac CT angiogram allows the health care provider to see how well blood is flowing to and from the heart. The health care provider will be able to see if there are any problems, such as:  Blockage or narrowing of the coronary arteries in the heart.  Fluid around the heart.  Signs of weakness or disease in the muscles, valves, and tissues of the heart.  Tell a health care provider about:  Any allergies you have. This is especially important if you have had a previous allergic reaction to contrast dye.  All medicines you are taking, including vitamins, herbs, eye drops, creams, and over-the-counter medicines.  Any blood disorders you have.  Any surgeries you have had.  Any medical conditions you have.  Whether you are pregnant or may be pregnant.  Any anxiety disorders, chronic pain, or other conditions you have that may increase your stress or prevent you from lying still. What are the risks? Generally, this is a safe procedure. However, problems may occur, including:  Bleeding.  Infection.  Allergic reactions to medicines or dyes.  Damage to other structures or organs.  Kidney damage from the contrast dye that is used.  Increased risk of cancer from radiation exposure. This risk is low.  Talk with your health care provider about: ? The risks and benefits of testing. ? How you can receive the lowest dose of radiation. What happens before the procedure?  Wear comfortable clothing and remove any jewelry, glasses, dentures, and hearing aids.  Follow instructions from your health care provider about eating and drinking. This may include: ? For 12 hours before the procedure -- avoid caffeine. This includes tea, coffee, soda, energy drinks, and diet pills. Drink plenty of water or other fluids that do not have caffeine in them. Being well hydrated can prevent complications. ? For 4-6 hours before the procedure -- stop eating and drinking. The contrast dye can cause nausea, but this is less likely if your stomach is empty.  Ask your health care provider about changing or stopping your regular medicines. This is especially important if you are taking diabetes medicines, blood thinners, or medicines to treat problems with erections (erectile dysfunction). What happens during the procedure?   Hair on your chest may need to be removed so that small sticky patches called electrodes can be placed on your chest. These will transmit information that helps to monitor your heart during the procedure.  An IV will be inserted into one of your veins.  You might be given a medicine to control your heart rate during the procedure. This will help to ensure that good images are obtained.  You will be asked to lie on an exam table. This table will slide in and out of the CT machine during the procedure.  Contrast dye will be injected into the IV. You might feel warm, or you may get a metallic taste in your mouth.  You will be given a medicine called nitroglycerin. This will relax or dilate the arteries in your heart.  The table that you are lying on will move into the CT machine tunnel for the scan.  The person running the machine will give you instructions while the scans are being done. You may  be asked to: ? Keep your arms above your head. ? Hold your breath. ? Stay very still, even if the table is moving.  When the scanning is complete, you will be moved out of the machine.  The IV will be removed. The procedure may vary among health care providers and hospitals. What can I expect after the procedure? After your procedure, it is common to have:  A metallic taste in your mouth from the contrast dye.  A feeling of warmth.  A headache from the nitroglycerin. Follow these instructions at home:  Take over-the-counter and prescription medicines only as told by your health care provider.  If you  are told, drink enough fluid to keep your urine pale yellow. This will help to flush the contrast dye out of your body.  Most people can return to their normal activities right after the procedure. Ask your health care provider what activities are safe for you.  It is up to you to get the results of your procedure. Ask your health care provider, or the department that is doing the procedure, when your results will be ready.  Keep all follow-up visits as told by your health care provider. This is important. Contact a health care provider if:  You have any symptoms of allergy to the contrast dye. These include: ? Shortness of breath. ? Rash or hives. ? A racing heartbeat. Summary  A cardiac CT angiogram is a procedure to look at the heart and the area around the heart. It may be done to help find the cause of chest pains or other symptoms of heart disease.  During this procedure, a large X-ray machine, called a CT scanner, takes detailed pictures of the heart and the surrounding area after a contrast dye has been injected into blood vessels in the area.  Ask your health care provider about changing or stopping your regular medicines before the procedure. This is especially important if you are taking diabetes medicines, blood thinners, or medicines to treat erectile  dysfunction.  If you are told, drink enough fluid to keep your urine pale yellow. This will help to flush the contrast dye out of your body. This information is not intended to replace advice given to you by your health care provider. Make sure you discuss any questions you have with your health care provider. Document Revised: 09/06/2018 Document Reviewed: 09/06/2018 Elsevier Patient Education  [place].   Echocardiogram An echocardiogram is a procedure that uses painless sound waves (ultrasound) to produce an image of the heart. Images from an echocardiogram can provide important information about:  Signs of coronary artery disease (CAD).  Aneurysm detection. An aneurysm is a weak or damaged part of an artery wall that bulges out from the normal force of blood pumping through the body.  Heart size and shape. Changes in the size or shape of the heart can be associated with certain conditions, including heart failure, aneurysm, and CAD.  Heart muscle function.  Heart valve function.  Signs of a past heart attack.  Fluid buildup around the heart.  Thickening of the heart muscle.  A tumor or infectious growth around the heart valves. Tell a health care provider about:  Any allergies you have.  All medicines you are taking, including vitamins, herbs, eye drops, creams, and over-the-counter medicines.  Any blood disorders you have.  Any surgeries you have had.  Any medical conditions you have.  Whether you are pregnant or may be pregnant. What are the risks? Generally, this is a safe procedure. However, problems may occur, including:  Allergic reaction to dye (contrast) that may be used during the procedure. What happens before the procedure? No specific preparation is needed. You may eat and drink normally. What happens during the procedure?   An IV tube may be inserted into one of your veins.  You may receive contrast through this tube. A contrast is an  injection that improves the quality of the pictures from your heart.  A gel will be applied to your chest.  A wand-like tool (transducer) will be moved over your chest. The gel will help to transmit the sound waves from the transducer.  The sound waves will harmlessly bounce off of your heart to allow the heart images to be captured in real-time motion. The images will be recorded on a computer. The procedure may vary among health care providers and hospitals. What happens after the procedure?  You may return to your normal, everyday life, including diet, activities, and medicines, unless your health care provider tells you not to do that. Summary  An echocardiogram is a procedure that uses painless sound waves (ultrasound) to produce an image of the heart.  Images from an echocardiogram can provide important information about the size and shape of your heart, heart muscle function, heart valve function, and fluid buildup around your heart.  You do not need to do anything to prepare before this procedure. You may eat and drink normally.  After the echocardiogram is completed, you may return to your normal, everyday life, unless your health care provider tells you not to do that. This information is not intended to replace advice given to you by your health care provider. Make sure you discuss any questions you have with your health care provider. Document Revised: 05/04/2018 Document Reviewed: 02/14/2016 Elsevier Patient Education  [place].

## 2019-11-06 NOTE — Progress Notes (Signed)
Cardiology Office Note    Date:  11/07/2019   ID:  Steven Holloway, DOB 1945-11-13, MRN [id number]  PCP:  Prince Solian, MD  Cardiologist:  Shelva Majestic, MD   New cardiology evaluation referred through the courtesy of Dr. Theotis Burrow at [facility] emergency room  History of Present Illness:  Steven Holloway is a 75 y.o. male who has a history of hypertension and recently had developed intermittent episodes of chest pain leading to an evaluation at [facility].  He is now referred for cardiology evaluation.  Steven Holloway has a history of hypertension for approximately 20 years and hyperlipidemia and has been on a medical regimen consisting of amlodipine 10 mg and pravastatin 20 mg.  He also has GERD for which he takes pantoprazole.  Recently he had developed left-sided chest pain which would seem to occur approximately 2 times per day.  Initially this was nonexertional and initially occurred when he was driving.  However subsequently he has noticed the development of chest pressure while walking up steps associated with exertional dyspnea.  He recently presented to [facility] in [place] for evaluation on November 03, 2019.  Cardiac enzymes were negative.  A D-dimer was negative.  On presentation blood pressure was 154/70 and pulse was in the 50s.  A chest x-ray did not reveal any acute cardiopulmonary disease.  His presentation was discussed by phone with Dr. Acie Fredrickson.  The patient is now referred for cardiology evaluation.   Past Medical History:  Diagnosis Date  . Allergy    sulfa and seasonal   . Arthritis   . Hx of adenomatous colonic polyps 05/27/2014  . Hypertension   . Seasonal allergies     Past Surgical History:  Procedure Laterality Date  . APPENDECTOMY  1980  . COLONOSCOPY    . LUMBAR LAMINECTOMY/DECOMPRESSION MICRODISCECTOMY N/A 04/22/2016   Procedure: Laminectomy and Foraminotomy - Lumbar Two-Three,Lumbar Three-Four,Lumbar Four-Five with  sublaminar decompression;  Surgeon: Eustace Moore, MD;  Location: [place];  Service: Neurosurgery;  Laterality: N/A;    Current Medications: Outpatient Medications Prior to Visit  Medication Sig Dispense Refill  . amLODipine (NORVASC) 10 MG tablet Take 10 mg by mouth daily.    Marland Kitchen aspirin EC 81 MG tablet Take 81 mg by mouth at bedtime.     Marland Kitchen CRANBERRY PO Take 1,500 mg by mouth daily.    Marland Kitchen escitalopram (LEXAPRO) 5 MG tablet Take 5 mg by mouth daily.    . fluticasone (FLONASE) 50 MCG/ACT nasal spray Place 1 spray into both nostrils at bedtime.    . pantoprazole (PROTONIX) 40 MG tablet pantoprazole 40 mg tablet,delayed release    . pravastatin (PRAVACHOL) 20 MG tablet Take 20 mg by mouth at bedtime.    . ciprofloxacin (CIPRO) 500 MG tablet Take 1 tablet (500 mg total) by mouth 2 (two) times daily. 14 tablet 0  . losartan-hydrochlorothiazide (HYZAAR) 100-25 MG per tablet Take 0.5 tablets by mouth 2 (two) times daily.     Facility-Administered Medications Prior to Visit  Medication Dose Route Frequency Provider Last Rate Last Admin  . 0.9 %  sodium chloride infusion  500 mL Intravenous Once Gatha Mayer, MD         Allergies:   Sulfa antibiotics and Sulfasalazine   Social History   Socioeconomic History  . Marital status: Married    Spouse name: Not on file  . Number of children: Not on file  . Years of  education: Not on file  . Highest education level: Not on file  Occupational History  . Not on file  Tobacco Use  . Smoking status: Former Research scientist (life sciences)  . Smokeless tobacco: Never Used  Vaping Use  . Vaping Use: Never used  Substance and Sexual Activity  . Alcohol use: Yes    Alcohol/week: 2.0 standard drinks    Types: 2 Cans of beer per week    Comment: occasional  . Drug use: No  . Sexual activity: Not on file  Other Topics Concern  . Not on file  Social History Narrative  . Not on file   Social Determinants of Health   Financial Resource Strain:   . Difficulty of Paying  Living Expenses: Not on file  Food Insecurity:   . Worried About Charity fundraiser in the Last Year: Not on file  . Ran Out of Food in the Last Year: Not on file  Transportation Needs:   . Lack of Transportation (Medical): Not on file  . Lack of Transportation (Non-Medical): Not on file  Physical Activity:   . Days of Exercise per Week: Not on file  . Minutes of Exercise per Session: Not on file  Stress:   . Feeling of Stress : Not on file  Social Connections:   . Frequency of Communication with Friends and Family: Not on file  . Frequency of Social Gatherings with Friends and Family: Not on file  . Attends Religious Services: Not on file  . Active Member of Clubs or Organizations: Not on file  . Attends Archivist Meetings: Not on file  . Marital Status: Not on file    Socially he is married for 54 years.  He has 2 children born in 44 in 29 and 3 grandchildren.  There is remote tobacco history but he quit smoking over 40 years ago.  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  Family History:  The patient's family history is notable that his father died at age 74 and had heart problems.  Mother died 2 weeks ago at age 48.  He has 1 brother age 24.  ROS General: Negative; No fevers, chills, or night sweats;  HEENT: Negative; No changes in vision or hearing, sinus congestion, difficulty swallowing Pulmonary: Negative; No cough, wheezing, shortness of breath, hemoptysis Cardiovascular: See HPI GI: Negative; No nausea, vomiting, diarrhea, or abdominal pain GU: Negative; No dysuria, hematuria, or difficulty voiding Musculoskeletal: Negative; no myalgias, joint pain, or weakness Hematologic/Oncology: Negative; no easy bruising, bleeding Endocrine: Negative; no heat/cold intolerance; no diabetes Neuro: Negative; no changes in balance, headaches Skin: Negative; No  rashes or skin lesions Psychiatric: Negative; No behavioral problems, depression Sleep: Positive for snoring; no daytime sleepiness, hypersomnolence, bruxism, restless legs, hypnogognic hallucinations, no cataplexy Other comprehensive 14 point system review is negative.   PHYSICAL EXAM:   VS:  BP 125/73   Pulse 67   Temp (!) 97 F (36.1 C)   Ht '5\' 8"'  (1.727 m)   Wt 211 lb (95.7 kg)   SpO2 97%   BMI 32.08 kg/m     Repeat blood pressure by me was 146/78  Wt Readings from Last 3 Encounters:  11/06/19 211 lb (95.7 kg)  11/03/19 206 lb (93.4 kg)  12/24/17 210 lb (95.3 kg)    General: Alert, oriented, no distress.  Skin: normal turgor, no rashes, warm and dry HEENT: Normocephalic,  atraumatic. Pupils equal round and reactive to light; sclera anicteric; extraocular muscles intact;  Nose without nasal septal hypertrophy Mouth/Parynx benign; Mallinpatti scale 3 Neck: No JVD, no carotid bruits; normal carotid upstroke Lungs: clear to ausculatation and percussion; no wheezing or rales Chest wall: without tenderness to palpitation Heart: PMI not displaced, RRR, s1 s2 normal, 1/6 systolic murmur, no diastolic murmur, no rubs, gallops, thrills, or heaves Abdomen: soft, nontender; no hepatosplenomehaly, BS+; abdominal aorta nontender and not dilated by palpation. Back: no CVA tenderness Pulses 2+ Musculoskeletal: full range of motion, normal strength, no joint deformities Extremities: no clubbing cyanosis or edema, Homan's sign negative  Neurologic: grossly nonfocal; Cranial nerves grossly wnl Psychologic: Normal mood and affect   Studies/Labs Reviewed:   EKG:  EKG is ordered today.  ECG (independently read by me): NSR at 67; No ST changes, no ectopy  Recent Labs: BMP Latest Ref Rng & Units 11/03/2019 12/24/2017 05/05/2016  Glucose 70 - 99 mg/dL 96 135(H) 142(H)  BUN 8 - 23 mg/dL '21 19 13  ' Creatinine 0.61 - 1.24 mg/dL 0.99 1.55(H) 1.23  Sodium 135 - 145 mmol/L 143 137 142  Potassium  3.5 - 5.1 mmol/L 4.3 3.6 3.4(L)  Chloride 98 - 111 mmol/L 100 98 105  CO2 22 - 32 mmol/L '31 28 28  ' Calcium 8.9 - 10.3 mg/dL 9.3 8.7(L) 9.6     Hepatic Function Latest Ref Rng & Units 11/03/2019 12/24/2017 05/05/2016  Total Protein 6.5 - 8.1 g/dL 6.6 6.7 7.0  Albumin 3.5 - 5.0 g/dL 3.6 3.3(L) 3.5  AST 15 - 41 U/L 18 36 31  ALT 0 - 44 U/L 21 36 26  Alk Phosphatase 38 - 126 U/L 76 80 67  Total Bilirubin 0.3 - 1.2 mg/dL 0.8 1.1 0.9    CBC Latest Ref Rng & Units 11/03/2019 12/24/2017 05/05/2016  WBC 4.0 - 10.5 K/uL 5.0 10.9(H) 7.2  Hemoglobin 13.0 - 17.0 g/dL 14.7 14.5 14.8  Hematocrit 39 - 52 % 43.7 45.2 42.3  Platelets 150 - 400 K/uL 153 148(L) 229   Lab Results  Component Value Date   MCV 88.3 11/03/2019   MCV 89.7 12/24/2017   MCV 85.3 05/05/2016   No results found for: TSH No results found for: HGBA1C   BNP    Component Value Date/Time   BNP 61.0 05/05/2016 0930    ProBNP No results found for: PROBNP   Lipid Panel  No results found for: CHOL, TRIG, HDL, CHOLHDL, VLDL, LDLCALC, LDLDIRECT, LABVLDL   RADIOLOGY: DG Chest 2 View  Result Date: 11/03/2019 CLINICAL DATA:  Left-sided chest pain. Ex-smoker, quitting 40 years ago. EXAM: CHEST - 2 VIEW COMPARISON:  12/24/2017 FINDINGS: Numerous leads and wires project over the chest. Midline trachea. Normal heart size and mediastinal contours. No pleural effusion or pneumothorax. Mild bibasilar scarring. IMPRESSION: No acute cardiopulmonary disease. Electronically Signed   By: Abigail Miyamoto M.D.   On: 11/03/2019 10:35     Additional studies/ records that were reviewed today include:  I have personally reviewed the medical records from [facility] ER evaluation  ASSESSMENT:    1. Chest tightness   2. Essential hypertension   3. Murmur   4. Pure hypercholesterolemia    PLAN:  Steven Holloway is a 74 year old gentleman who has a 20-year history of hypertension as well as a history of hyperlipidemia.  Remotely he  had been on Hyzaar but ultimately was switched to amlodipine for which he currently is on 10 mg daily.  He also has a history of hyperlipidemia and is on pravastatin 20 mg.  Steven Holloway is his primary physician.  LDL cholesterol in February 2021 was 103.  He recently had developed left-sided chest discomfort which initially occurred while he was driving.  However subsequently he has noticed chest tightness particularly if he walks fast up steps associated with shortness of breath.  His blood pressure today is stable on initial evaluation but was slightly increased on repeat by me and was elevated upon presentation to the emergency room.  With his longstanding hypertension as well as some shortness of breath and his mild cardiac murmur I am recommending he undergo a 2D echo Doppler study.  In light of his symptom complex I have recommended he undergo coronary CTA for evaluation of coronary obstructive disease as well as calcium score.  With his LDL cholesterol being 103 on pravastatin 20 mg I have recommended at least initial titration to 40 mg daily.  I will initiate metoprolol succinate 25 mg daily.  I have recommended follow-up office evaluation in approximately 6 weeks in follow-up of the above studies and further recommendations will be made at that time.   Medication Adjustments/Labs and Tests Ordered: Current medicines are reviewed at length with the patient today.  Concerns regarding medicines are outlined above.  Medication changes, Labs and Tests ordered today are listed in the Patient Instructions below. Patient Instructions  Medication Instructions:  INCREASE YOUR PRAVASTATIN TO 40 MG DAILY   START METOPROLOL SUC 25 MG DAILY   *If you need a refill on your cardiac medications before your next appointment, please call your pharmacy*   Lab Work: BMET 1 WEEK PRIOR TO CT   FASTING LP/CMET FEW DAYS PRIOR TO YOU FOLLOW UP   If you have labs (blood work) drawn today and your tests are completely  normal, you will receive your results only by: Marland Kitchen MyChart Message (if you have MyChart) OR . A paper copy in the mail If you have any lab test that is abnormal or we need to change your treatment, we will call you to review the results.  Testing/Procedures: Your physician has requested that you have an echocardiogram. Echocardiography is a painless test that uses sound waves to create images of your heart. It provides your doctor with information about the size and shape of your heart and how well your heart's chambers and valves are working. This procedure takes approximately one hour. There are no restrictions for this procedure. [address]  Your physician has requested that you have cardiac CT. Cardiac computed tomography (CT) is a painless test that uses an x-ray machine to take clear, detailed pictures of your heart. For further information please visit HugeFiesta.tn. Please follow instruction sheet as given. ONCE INSURANCE HAS BEEN REVIEWED THE OFFICE WILL CALL YOU TO SCHEDULE. IF YOU DO NOT HEAR FROM THE OFFICE IN 2 WEEKS CALL TO FOLLOW UP   Follow-Up: At [facility], you and your health needs are our priority.  As part of our continuing mission to provide you with exceptional heart care, we have created designated Provider Care Teams.  These Care Teams include your primary Cardiologist (physician) and Advanced Practice Providers (APPs -  Physician Assistants and Nurse Practitioners) who all work together to provide you with the care you need, when you need it.  We recommend signing up for the patient portal called "MyChart".  Sign up information is provided on this After Visit Summary.  MyChart  is used to connect with patients for Virtual Visits (Telemedicine).  Patients are able to view lab/test results, encounter notes, upcoming appointments, etc.  Non-urgent messages can be sent to your provider as well.   To learn more about what you can do with  MyChart, go to [email or web address].    Your next appointment:   6-8  week(s)  The format for your next appointment:   In Person  Provider:   You may see DR Claiborne Billings  or one of the following Advanced Practice Providers on your designated Care Team:    Almyra Deforest, PA-C  Fabian Sharp, PA-C or   Roby Lofts, [place]  Other Instructions  Your cardiac CT will be scheduled at one of the below locations:   [facility] [address] [place], [place] [address] [phone]  [facility] [address] [place], [place] [address] [phone]  If scheduled at [facility], please arrive at the [facility] main entrance of [facility] 30 minutes prior to test start time. Proceed to the [facility] Radiology Department (first floor) to check-in and test prep.  If scheduled at [facility], please arrive 15 mins early for check-in and test prep.  Please follow these instructions carefully (unless otherwise directed):  Hold all erectile dysfunction medications at least 3 days (72 hrs) prior to test.  On the Night Before the Test: . Be sure to Drink plenty of water. . Do not consume any caffeinated/decaffeinated beverages or chocolate 12 hours prior to your test. . Do not take any antihistamines 12 hours prior to your test. . If the patient has contrast allergy: ? Patient will need a prescription for Prednisone and very clear instructions (as follows): 1. Prednisone 50 mg - take 13 hours prior to test 2. Take another Prednisone 50 mg 7 hours prior to test 3. Take another Prednisone 50 mg 1 hour prior to test 4. Take Benadryl 50 mg 1 hour prior to test . Patient must complete all four doses of above prophylactic medications. . Patient will need a ride after test due to Benadryl.  On the Day of the Test: . Drink plenty of water. Do not drink any water within one hour of the  test. . Do not eat any food 4 hours prior to the test. . You may take your regular medications prior to the test.  . Take metoprolol (Lopressor) two hours prior to test. . HOLD Furosemide/Hydrochlorothiazide morning of the test. . FEMALES- please wear underwire-free bra if available      After the Test: . Drink plenty of water. . After receiving IV contrast, you may experience a mild flushed feeling. This is normal. . On occasion, you may experience a mild rash up to 24 hours after the test. This is not dangerous. If this occurs, you can take Benadryl 25 mg and increase your fluid intake. . If you experience trouble breathing, this can be serious. If it is severe call 911 IMMEDIATELY. If it is mild, please call our office. . If you take any of these medications: Glipizide/Metformin, Avandament, Glucavance, please do not take 48 hours after completing test unless otherwise instructed.   Once we have confirmed authorization from your insurance company, we will call you to set up a date and time for your test. Based on how quickly your insurance processes prior authorizations requests, please allow up to 4 weeks to be contacted for scheduling your Cardiac CT  appointment. Be advised that routine Cardiac CT appointments could be scheduled as many as 8 weeks after your provider has ordered it.  For non-scheduling related questions, please contact the cardiac imaging nurse navigator should you have any questions/concerns: Marchia Bond, Cardiac Imaging Nurse Navigator Burley Saver, Interim Cardiac Imaging Nurse [place] [facility] Direct Office Dial: [phone]   For scheduling needs, including cancellations and rescheduling, please call Vivien Rota at [phone], option [phone].   Cardiac CT Angiogram A cardiac CT angiogram is a procedure to look at the heart and the area around the heart. It may be done to help find the cause of chest pains or other symptoms of heart disease.  During this procedure, a substance called contrast dye is injected into the blood vessels in the area to be checked. A large X-ray machine, called a CT scanner, then takes detailed pictures of the heart and the surrounding area. The procedure is also sometimes called a coronary CT angiogram, coronary artery scanning, or CTA. A cardiac CT angiogram allows the health care provider to see how well blood is flowing to and from the heart. The health care provider will be able to see if there are any problems, such as:  Blockage or narrowing of the coronary arteries in the heart.  Fluid around the heart.  Signs of weakness or disease in the muscles, valves, and tissues of the heart. Tell a health care provider about:  Any allergies you have. This is especially important if you have had a previous allergic reaction to contrast dye.  All medicines you are taking, including vitamins, herbs, eye drops, creams, and over-the-counter medicines.  Any blood disorders you have.  Any surgeries you have had.  Any medical conditions you have.  Whether you are pregnant or may be pregnant.  Any anxiety disorders, chronic pain, or other conditions you have that may increase your stress or prevent you from lying still. What are the risks? Generally, this is a safe procedure. However, problems may occur, including:  Bleeding.  Infection.  Allergic reactions to medicines or dyes.  Damage to other structures or organs.  Kidney damage from the contrast dye that is used.  Increased risk of cancer from radiation exposure. This risk is low. Talk with your health care provider about: ? The risks and benefits of testing. ? How you can receive the lowest dose of radiation. What happens before the procedure?  Wear comfortable clothing and remove any jewelry, glasses, dentures, and hearing aids.  Follow instructions from your health care provider about eating and drinking. This may include: ? For 12 hours  before the procedure -- avoid caffeine. This includes tea, coffee, soda, energy drinks, and diet pills. Drink plenty of water or other fluids that do not have caffeine in them. Being well hydrated can prevent complications. ? For 4-6 hours before the procedure -- stop eating and drinking. The contrast dye can cause nausea, but this is less likely if your stomach is empty.  Ask your health care provider about changing or stopping your regular medicines. This is especially important if you are taking diabetes medicines, blood thinners, or medicines to treat problems with erections (erectile dysfunction). What happens during the procedure?   Hair on your chest may need to be removed so that small sticky patches called electrodes can be placed on your chest. These will transmit information that helps to monitor your heart during the procedure.  An IV will be inserted into one of your veins.  You might be given a medicine to control your heart rate during the procedure. This will help to ensure that good images are obtained.  You will be asked to lie on an exam table. This table will slide in and out of the CT machine during the procedure.  Contrast dye will be injected into the IV. You might feel warm, or you may get a metallic taste in your mouth.  You will be given a medicine called nitroglycerin. This will relax or dilate the arteries in your heart.  The table that you are lying on will move into the CT machine tunnel for the scan.  The person running the machine will give you instructions while the scans are being done. You may be asked to: ? Keep your arms above your head. ? Hold your breath. ? Stay very still, even if the table is moving.  When the scanning is complete, you will be moved out of the machine.  The IV will be removed. The procedure may vary among health care providers and hospitals. What can I expect after the procedure? After your procedure, it is common to have:  A  metallic taste in your mouth from the contrast dye.  A feeling of warmth.  A headache from the nitroglycerin. Follow these instructions at home:  Take over-the-counter and prescription medicines only as told by your health care provider.  If you are told, drink enough fluid to keep your urine pale yellow. This will help to flush the contrast dye out of your body.  Most people can return to their normal activities right after the procedure. Ask your health care provider what activities are safe for you.  It is up to you to get the results of your procedure. Ask your health care provider, or the department that is doing the procedure, when your results will be ready.  Keep all follow-up visits as told by your health care provider. This is important. Contact a health care provider if:  You have any symptoms of allergy to the contrast dye. These include: ? Shortness of breath. ? Rash or hives. ? A racing heartbeat. Summary  A cardiac CT angiogram is a procedure to look at the heart and the area around the heart. It may be done to help find the cause of chest pains or other symptoms of heart disease.  During this procedure, a large X-ray machine, called a CT scanner, takes detailed pictures of the heart and the surrounding area after a contrast dye has been injected into blood vessels in the area.  Ask your health care provider about changing or stopping your regular medicines before the procedure. This is especially important if you are taking diabetes medicines, blood thinners, or medicines to treat erectile dysfunction.  If you are told, drink enough fluid to keep your urine pale yellow. This will help to flush the contrast dye out of your body. This information is not intended to replace advice given to you by your health care provider. Make sure you discuss any questions you have with your health care provider. Document Revised: 09/06/2018 Document Reviewed: 09/06/2018 Elsevier  Patient Education  [place].   Echocardiogram An echocardiogram is a procedure that uses painless sound waves (ultrasound) to produce an image of the heart. Images from an echocardiogram can provide important information about:  Signs of coronary artery disease (CAD).  Aneurysm detection. An aneurysm is a weak or damaged part of an artery wall that bulges out from the normal  force of blood pumping through the body.  Heart size and shape. Changes in the size or shape of the heart can be associated with certain conditions, including heart failure, aneurysm, and CAD.  Heart muscle function.  Heart valve function.  Signs of a past heart attack.  Fluid buildup around the heart.  Thickening of the heart muscle.  A tumor or infectious growth around the heart valves. Tell a health care provider about:  Any allergies you have.  All medicines you are taking, including vitamins, herbs, eye drops, creams, and over-the-counter medicines.  Any blood disorders you have.  Any surgeries you have had.  Any medical conditions you have.  Whether you are pregnant or may be pregnant. What are the risks? Generally, this is a safe procedure. However, problems may occur, including:  Allergic reaction to dye (contrast) that may be used during the procedure. What happens before the procedure? No specific preparation is needed. You may eat and drink normally. What happens during the procedure?   An IV tube may be inserted into one of your veins.  You may receive contrast through this tube. A contrast is an injection that improves the quality of the pictures from your heart.  A gel will be applied to your chest.  A wand-like tool (transducer) will be moved over your chest. The gel will help to transmit the sound waves from the transducer.  The sound waves will harmlessly bounce off of your heart to allow the heart images to be captured in real-time motion. The images will be  recorded on a computer. The procedure may vary among health care providers and hospitals. What happens after the procedure?  You may return to your normal, everyday life, including diet, activities, and medicines, unless your health care provider tells you not to do that. Summary  An echocardiogram is a procedure that uses painless sound waves (ultrasound) to produce an image of the heart.  Images from an echocardiogram can provide important information about the size and shape of your heart, heart muscle function, heart valve function, and fluid buildup around your heart.  You do not need to do anything to prepare before this procedure. You may eat and drink normally.  After the echocardiogram is completed, you may return to your normal, everyday life, unless your health care provider tells you not to do that. This information is not intended to replace advice given to you by your health care provider. Make sure you discuss any questions you have with your health care provider. Document Revised: 05/04/2018 Document Reviewed: 02/14/2016 Elsevier Patient Education  2020 [place], Shelva Majestic, MD  11/07/2019 4:20 PM    [facility] [address], [place], [place], [place]  [address] Phone: [phone]

## 2019-11-07 ENCOUNTER — Encounter: Payer: Self-pay | Admitting: Cardiovascular Disease

## 2019-11-19 ENCOUNTER — Telehealth (HOSPITAL_COMMUNITY): Payer: Self-pay | Admitting: Emergency Medicine

## 2019-11-19 NOTE — Telephone Encounter (Signed)
Attempted to call patient regarding upcoming cardiac CT appointment. °Left message on voicemail with name and callback number °Coran Dipaola RN Navigator Cardiac Imaging °[facility] °[phone] Office °[phone] Cell ° °

## 2019-11-20 ENCOUNTER — Ambulatory Visit (HOSPITAL_COMMUNITY)
Admission: RE | Admit: 2019-11-20 | Discharge: 2019-11-20 | Disposition: A | Discharge status: Home / Self Care | Payer: Medicare Other | Source: Ambulatory Visit | Attending: Cardiovascular Disease | Admitting: Cardiovascular Disease

## 2019-11-20 DIAGNOSIS — I10 Essential (primary) hypertension: Secondary | ICD-10-CM | POA: Diagnosis present

## 2019-11-20 DIAGNOSIS — R011 Cardiac murmur, unspecified: Secondary | ICD-10-CM | POA: Diagnosis present

## 2019-11-20 DIAGNOSIS — R0789 Other chest pain: Secondary | ICD-10-CM | POA: Diagnosis present

## 2019-11-20 DIAGNOSIS — I2510 Atherosclerotic heart disease of native coronary artery without angina pectoris: Secondary | ICD-10-CM | POA: Diagnosis not present

## 2019-11-20 MED ORDER — NITROGLYCERIN 0.4 MG SL SUBL
0.8000 mg | SUBLINGUAL_TABLET | Freq: Once | SUBLINGUAL | Status: AC
  Administered 2019-11-20: 0.8 mg via SUBLINGUAL

## 2019-11-20 MED ORDER — IOHEXOL 350 MG/ML SOLN
80.0000 mL | Freq: Once | INTRAVENOUS | Status: AC | PRN
  Administered 2019-11-20: 80 mL via INTRAVENOUS

## 2019-11-20 MED ORDER — NITROGLYCERIN 0.4 MG SL SUBL
SUBLINGUAL_TABLET | SUBLINGUAL | Status: AC
  Filled 2019-11-20: qty 2

## 2019-11-21 ENCOUNTER — Ambulatory Visit (HOSPITAL_COMMUNITY)
Admission: RE | Admit: 2019-11-21 | Discharge: 2019-11-21 | Disposition: A | Discharge status: Home / Self Care | Payer: Medicare Other | Source: Ambulatory Visit | Attending: Cardiovascular Disease | Admitting: Cardiovascular Disease

## 2019-11-21 DIAGNOSIS — I10 Essential (primary) hypertension: Secondary | ICD-10-CM | POA: Insufficient documentation

## 2019-11-21 DIAGNOSIS — R0789 Other chest pain: Secondary | ICD-10-CM | POA: Diagnosis present

## 2019-11-21 DIAGNOSIS — I2510 Atherosclerotic heart disease of native coronary artery without angina pectoris: Secondary | ICD-10-CM | POA: Diagnosis not present

## 2019-11-21 DIAGNOSIS — R011 Cardiac murmur, unspecified: Secondary | ICD-10-CM | POA: Insufficient documentation

## 2019-11-27 ENCOUNTER — Ambulatory Visit (HOSPITAL_COMMUNITY): Payer: Medicare Other | Attending: Cardiovascular Disease

## 2019-11-27 ENCOUNTER — Other Ambulatory Visit: Payer: Self-pay

## 2019-11-27 DIAGNOSIS — I10 Essential (primary) hypertension: Secondary | ICD-10-CM

## 2019-11-27 DIAGNOSIS — R011 Cardiac murmur, unspecified: Secondary | ICD-10-CM | POA: Diagnosis present

## 2019-11-27 DIAGNOSIS — R0789 Other chest pain: Secondary | ICD-10-CM | POA: Insufficient documentation

## 2019-11-27 LAB — ECHOCARDIOGRAM COMPLETE
Area-P 1/2: 3.83 cm2
P 1/2 time: 683 msec
S' Lateral: 3.3 cm

## 2019-11-30 ENCOUNTER — Telehealth: Payer: Self-pay | Admitting: Cardiovascular Disease

## 2019-11-30 NOTE — Telephone Encounter (Signed)
Patient wife called in requesting someone call Steven Holloway, the patient, to go over test results from echo and CT done on 10/25 and 11/02. Please call/advise. Thank you!

## 2019-11-30 NOTE — Telephone Encounter (Signed)
Spoke with patient. Informed patient we are still pending FFR and Echo results. Spouse and patient verbalized understanding. Patient will be dropping off a surgical clearance form for an orthopedic procedure.

## 2019-12-05 NOTE — Telephone Encounter (Signed)
Patient calling in to see if there is any update on test results. Advised that someone would reach out as soon as we have the results.

## 2020-01-07 ENCOUNTER — Other Ambulatory Visit: Payer: Self-pay

## 2020-01-07 ENCOUNTER — Encounter: Payer: Self-pay | Admitting: Cardiovascular Disease

## 2020-01-07 ENCOUNTER — Ambulatory Visit: Payer: Medicare Other | Admitting: Cardiovascular Disease

## 2020-01-07 VITALS — BP 119/61 | HR 61 | Ht 68.0 in | Wt 213.0 lb

## 2020-01-07 DIAGNOSIS — E785 Hyperlipidemia, unspecified: Secondary | ICD-10-CM | POA: Diagnosis not present

## 2020-01-07 DIAGNOSIS — R931 Abnormal findings on diagnostic imaging of heart and coronary circulation: Secondary | ICD-10-CM

## 2020-01-07 DIAGNOSIS — I2510 Atherosclerotic heart disease of native coronary artery without angina pectoris: Secondary | ICD-10-CM

## 2020-01-07 DIAGNOSIS — I10 Essential (primary) hypertension: Secondary | ICD-10-CM | POA: Diagnosis not present

## 2020-01-07 DIAGNOSIS — I5189 Other ill-defined heart diseases: Secondary | ICD-10-CM

## 2020-01-07 MED ORDER — ROSUVASTATIN CALCIUM 20 MG PO TABS: 20.0000 mg | ORAL_TABLET | Freq: Every day | ORAL | 3 refills | Status: DC

## 2020-01-07 NOTE — Patient Instructions (Signed)
Medication Instructions:  STOP taking the pravastatin START taking rosuvastatin 20 mg by mouth once a day *If you need a refill on your cardiac medications before your next appointment, please call your pharmacy*   Lab Work: None ordered If you have labs (blood work) drawn today and your tests are completely normal, you will receive your results only by: [facility] MyChart Message (if you have MyChart) OR . A paper copy in the mail If you have any lab test that is abnormal or we need to change your treatment, we will call you to review the results.   Testing/Procedures: None ordered   Follow-Up: At [facility], you and your health needs are our priority.  As part of our continuing mission to provide you with exceptional heart care, we have created designated Provider Care Teams.  These Care Teams include your primary Cardiologist (physician) and Advanced Practice Providers (APPs -  Physician Assistants and Nurse Practitioners) who all work together to provide you with the care you need, when you need it.  We recommend signing up for the patient portal called "MyChart".  Sign up information is provided on this After Visit Summary.  MyChart is used to connect with patients for Virtual Visits (Telemedicine).  Patients are able to view lab/test results, encounter notes, upcoming appointments, etc.  Non-urgent messages can be sent to your provider as well.   To learn more about what you can do with MyChart, go to [email or web address].    Your next appointment:   6 month(s)  The format for your next appointment:   In Person  Provider:   Shelva Majestic, MD  Your physician wants you to follow-up in: 6 months. You will receive a reminder letter in the mail two months in advance. If you don't receive a letter, please call our office to schedule the follow-up appointment.   Other Instructions None

## 2020-01-07 NOTE — Progress Notes (Signed)
Cardiology Office Note    Date:  01/08/2020   ID:  Steven Holloway, DOB 1945/03/13, MRN [id number]  PCP:  Prince Solian, MD  Cardiologist:  Shelva Majestic, MD   F/U cardiology evaluation referred through the courtesy of Dr. Theotis Burrow at [facility] emergency room  History of Present Illness:  Steven Holloway is a 74 y.o. male who has a history of hypertension and recently had developed intermittent episodes of chest pain leading to an evaluation at [facility].  I saw him for initial evaluation on November 06, 2019.  He presents for 38-monthfollow-up evaluation.  [name] a history of hypertension for approximately 20 years and hyperlipidemia and has been on a medical regimen consisting of amlodipine 10 mg and pravastatin 20 mg.  He also has GERD for which he takes pantoprazole.  Recently he had developed left-sided chest pain which would seem to occur approximately 2 times per day.  Initially this was nonexertional and initially occurred when he was driving.  However subsequently he has noticed the development of chest pressure while walking up steps associated with exertional dyspnea.  [facility]  Cardiac enzymes were negative.  A D-dimer was negative.  On presentation blood pressure was 154/70 and pulse was in the 50s.  A chest x-ray did not reveal any acute cardiopulmonary disease.  His presentation was discussed by phone with Dr. NAcie Fredrickson  I saw him for initial evaluation on November 06, 2019.  With his longstanding hypertension as well as some shortness of breath in addition to a mild cardiac murmur I recommended he undergo 2D echo Doppler study.  I also recommended coronary CTA to obtain a calcium score as well as evaluate for cardiac structural disease.  With his LDL cholesterol at 103 on pravastatin 20 mg I recommended at least titration to 40 mg daily.  I initiated therapy with  metoprolol succinate 25 mg.  Since I saw him, he underwent a 2D echo Doppler study to 2021.  This showed an EF of 60 to 65%.  There was mild concentric LVH with grade 2 diastolic dysfunction.  Coronary CTA showed a calcium score of 266, which was 577percentile for age and sex matched control.  There was moderate plaque with 25 to 49% in the proximal RCA and 50 to 69% in the distal RCA.  The LAD had 25 to 49% proximally with possible 50 to 69% in the mid level.  There was plaque in the first diagonal vessel sequentially.  There was minimal plaque in a ramus vessel.  FFR analysis was essentially negative.  Presently, Steven Holloway well.  He has been under evaluation and may require knee replacement surgery.  He denies any chest pain or significant shortness of breath.  He has tolerated metoprolol.  He presents for evaluation   Past Medical History:  Diagnosis Date   Allergy    sulfa and seasonal    Arthritis    Hx of adenomatous colonic polyps 05/27/2014   Hypertension    Seasonal allergies     Past Surgical History:  Procedure Laterality Date   APPENDECTOMY  1980   COLONOSCOPY     LUMBAR LAMINECTOMY/DECOMPRESSION MICRODISCECTOMY N/A 04/22/2016   Procedure: Laminectomy and Foraminotomy - Lumbar Two-Three,Lumbar Three-Four,Lumbar Four-Five with sublaminar decompression;  Surgeon: DEustace Moore MD;  [facility]  Service: Neurosurgery;  Laterality: N/A;  Current Medications: Outpatient Medications Prior to Visit  Medication Sig Dispense Refill   amLODipine (NORVASC) 10 MG tablet Take 10 mg by mouth daily.     aspirin EC 81 MG tablet Take 81 mg by mouth at bedtime.      CRANBERRY PO Take 1,500 mg by mouth daily.     escitalopram (LEXAPRO) 5 MG tablet Take 5 mg by mouth daily.     fluticasone (FLONASE) 50 MCG/ACT nasal spray Place 1 spray into both nostrils at bedtime.     metoprolol succinate (TOPROL XL) 25 MG 24 hr tablet Take 1 tablet (25 mg total) by mouth daily. 90  tablet 1   pantoprazole (PROTONIX) 40 MG tablet pantoprazole 40 mg tablet,delayed release     pravastatin (PRAVACHOL) 40 MG tablet Take 1 tablet (40 mg total) by mouth at bedtime. 90 tablet 1   Facility-Administered Medications Prior to Visit  Medication Dose Route Frequency Provider Last Rate Last Admin   0.9 %  sodium chloride infusion  500 mL Intravenous Once Gatha Mayer, MD         Allergies:   Sulfa antibiotics and Sulfasalazine   Social History   Socioeconomic History   Marital status: Married    Spouse name: Not on file   Number of children: Not on file   Years of education: Not on file   Highest education level: Not on file  Occupational History   Not on file  Tobacco Use   Smoking status: Former Smoker   Smokeless tobacco: Never Used  Vaping Use   Vaping Use: Never used  Substance and Sexual Activity   Alcohol use: Yes    Alcohol/week: 2.0 standard drinks    Types: 2 Cans of beer per week    Comment: occasional   Drug use: No   Sexual activity: Not on file  Other Topics Concern   Not on file  Social History Narrative   Not on file   Social Determinants of Health   Financial Resource Strain: Not on file  Food Insecurity: Not on file  Transportation Needs: Not on file  Physical Activity: Not on file  Stress: Not on file  Social Connections: Not on file    Socially he is married for 54 years.  He has 2 children born in 26 in 4 and 3 grandchildren.  There is remote tobacco history but he quit smoking over 40 years ago.  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  Family History:  The patient's family history is notable that his father died at age 57 and had heart problems.  Mother died 2 weeks ago at age 101.  He has 1 brother age 49.  ROS General: Negative; No fevers, chills, or night sweats;  HEENT: Negative; No changes in  vision or hearing, sinus congestion, difficulty swallowing Pulmonary: Negative; No cough, wheezing, shortness of breath, hemoptysis Cardiovascular: See HPI GI: Negative; No nausea, vomiting, diarrhea, or abdominal pain GU: Negative; No dysuria, hematuria, or difficulty voiding Musculoskeletal: May require knee replacement Hematologic/Oncology: Negative; no easy bruising, bleeding Endocrine: Negative; no heat/cold intolerance; no diabetes Neuro: Negative; no changes in balance, headaches Skin: Negative; No rashes or skin lesions Psychiatric: Negative; No behavioral problems, depression Sleep: Positive for snoring; no daytime sleepiness, hypersomnolence, bruxism, restless legs, hypnogognic hallucinations, no cataplexy Other comprehensive 14 point system review is negative.   PHYSICAL EXAM:   VS:  BP  119/61    Pulse 61    Ht '5\' 8"'  (1.727 m)    Wt 213 lb (96.6 kg)    SpO2 99%    BMI 32.39 kg/m     Repeat blood pressure by me was 130/70  Wt Readings from Last 3 Encounters:  01/07/20 213 lb (96.6 kg)  11/06/19 211 lb (95.7 kg)  11/03/19 206 lb (93.4 kg)    General: Alert, oriented, no distress.  Skin: normal turgor, no rashes, warm and dry HEENT: Normocephalic, atraumatic. Pupils equal round and reactive to light; sclera anicteric; extraocular muscles intact;  Nose without nasal septal hypertrophy Mouth/Parynx benign; Mallinpatti scale 3 Neck: No JVD, no carotid bruits; normal carotid upstroke Lungs: clear to ausculatation and percussion; no wheezing or rales Chest wall: without tenderness to palpitation Heart: PMI not displaced, RRR, s1 s2 normal, 1/6 systolic murmur, no diastolic murmur, no rubs, gallops, thrills, or heaves Abdomen: soft, nontender; no hepatosplenomehaly, BS+; abdominal aorta nontender and not dilated by palpation. Back: no CVA tenderness Pulses 2+ Musculoskeletal: full range of motion, normal strength, no joint deformities Extremities: no clubbing cyanosis or  edema, Homan's sign negative  Neurologic: grossly nonfocal; Cranial nerves grossly wnl Psychologic: Normal mood and affect   Studies/Labs Reviewed:   EKG:  EKG is ordered today.  ECG (independently read by me): NSR at 61, no ectopy, normal intervals  November 06, 2019 ECG (independently read by me): NSR at 67; No ST changes, no ectopy  Recent Labs: BMP Latest Ref Rng & Units 11/03/2019 12/24/2017 05/05/2016  Glucose 70 - 99 mg/dL 96 135(H) 142(H)  BUN 8 - 23 mg/dL '21 19 13  ' Creatinine 0.61 - 1.24 mg/dL 0.99 1.55(H) 1.23  Sodium 135 - 145 mmol/L 143 137 142  Potassium 3.5 - 5.1 mmol/L 4.3 3.6 3.4(L)  Chloride 98 - 111 mmol/L 100 98 105  CO2 22 - 32 mmol/L '31 28 28  ' Calcium 8.9 - 10.3 mg/dL 9.3 8.7(L) 9.6     Hepatic Function Latest Ref Rng & Units 11/03/2019 12/24/2017 05/05/2016  Total Protein 6.5 - 8.1 g/dL 6.6 6.7 7.0  Albumin 3.5 - 5.0 g/dL 3.6 3.3(L) 3.5  AST 15 - 41 U/L 18 36 31  ALT 0 - 44 U/L 21 36 26  Alk Phosphatase 38 - 126 U/L 76 80 67  Total Bilirubin 0.3 - 1.2 mg/dL 0.8 1.1 0.9    CBC Latest Ref Rng & Units 11/03/2019 12/24/2017 05/05/2016  WBC 4.0 - 10.5 K/uL 5.0 10.9(H) 7.2  Hemoglobin 13.0 - 17.0 g/dL 14.7 14.5 14.8  Hematocrit 39.0 - 52.0 % 43.7 45.2 42.3  Platelets 150 - 400 K/uL 153 148(L) 229   Lab Results  Component Value Date   MCV 88.3 11/03/2019   MCV 89.7 12/24/2017   MCV 85.3 05/05/2016   No results found for: TSH No results found for: HGBA1C   BNP    Component Value Date/Time   BNP 61.0 05/05/2016 0930    ProBNP No results found for: PROBNP   Lipid Panel  No results found for: CHOL, TRIG, HDL, CHOLHDL, VLDL, LDLCALC, LDLDIRECT, LABVLDL   RADIOLOGY: No results found.   Additional studies/ records that were reviewed today include:  I have personally reviewed the medical records from [place] [facility] evaluation  ECHO: 12/05/2019 IMPRESSIONS  1. Left ventricular ejection fraction, by estimation, is 60 to 65%. The  left  ventricle has normal function. The left ventricle has no regional  wall motion abnormalities. There is mild concentric  left ventricular  hypertrophy. Left ventricular diastolic  parameters are consistent with Grade II diastolic dysfunction  (pseudonormalization). Elevated left ventricular end-diastolic pressure.  2. Right ventricular systolic function is normal. The right ventricular  size is normal.  3. Left atrial size was mildly dilated.  4. The mitral valve is normal in structure. Trivial mitral valve  regurgitation. No evidence of mitral stenosis.  5. The aortic valve is tricuspid. Aortic valve regurgitation is trivial.  No aortic stenosis is present.  6. The inferior vena cava is normal in size with greater than 50%  respiratory variability, suggesting right atrial pressure of 3 mmHg.    EXAM: Cardiac/Coronary  CT  TECHNIQUE: The patient was scanned on a [employer].  FINDINGS: A 120 kV prospective scan was triggered in the descending thoracic aorta at 111 HU's. Axial non-contrast 3 mm slices were carried out through the heart. The data set was analyzed on a dedicated work station and scored using the [place]. Gantry rotation speed was 250 msecs and collimation was .6 mm. No beta blockade and 0.8 mg of sl NTG was given. The 3D data set was reconstructed in 5% intervals of the 67-82 % of the R-R cycle. Diastolic phases were analyzed on a dedicated work station using MPR, MIP and VRT modes. The patient received 80 cc of contrast.  Aorta:  Normal size.  No calcifications.  No dissection.  Aortic Valve:  Trileaflet.  No calcifications.  Coronary Arteries:  Normal coronary origin.  Right dominance.  RCA is a large dominant artery that gives rise to PDA and PLVB. There is mild calcified plaque in the proximal RCA with associated stenosis of 25-49%. There is moderate soft plaque in the distal RCA with associated stenosis of 50-69%.  Left main  is a large artery that gives rise to LAD, Ramus and LCX arteries. There is no plaque.  LAD is a large vessel that gives rise to a large branching diagonal. There is mild calcified plaque in the proximal LAD with associated stenosis of 25-49%. There is moderate mixed plaque in the mid LAD with associated stenosis of 50-69% and sequential mild soft plaque with associated stenosis of 25-49%. There is mild calcified plaque in the proximal D1 with associated stenosis of 25-49% and sequential severe soft plaque with associated stenosis of 70-99%.  The Ramus is a moderate sized vessel. There is minimal calcified plaque in the proximal LAD with associated stenosis of 0-24%.  LCX is a non-dominant artery that gives rise to one large OM1 branch. There is no plaque.  Other findings:  Normal pulmonary vein drainage into the left atrium.  Normal let atrial appendage without a thrombus.  Normal size of the pulmonary artery.  Patent Foramen Ovale.  IMPRESSION: 1. Coronary calcium score of 266. This was 54th percentile for age and sex matched control.  2.  Normal coronary origin with right dominance.  3.  2 vessel moderate to severe atherosclerosis.  CAD RADS 4a.  4. Consider symptom-guided anti-ischemic and preventive pharmacotherapy as  5.  The study has been submitted for FFR flow analysis.  EXAM: FFRCT ANALYSIS  FINDINGS: FFRct analysis was performed on the original cardiac CT angiogram dataset. Diagrammatic representation of the FFRct analysis is provided in a separate PDF document in PACS. This dictation was created using the PDF document and an interactive 3D model of the results. 3D model is not available in the EMR/PACS. Normal FFR range is >0.80.  1. Left Main: No significant stenosis.  LM  FFR = 0.97.  2. LAD: No significant stenosis. Proximal FFR = 0.95, Mid FFR = 0.88, Distal FFR = 0.68. Proximal D1 FFR = 0.91 and Distal D1 FFR = 0.85.  3. LCX: No  significant stenosis. Proximal FFR = 0.97, Mid FFR = 0.90, Distal FFR = 0.86.  4. RCA: No significant stenosis. Proximal FFR = 0.99, Mid FFR = 0.98, Distal FFR = 0.93.  IMPRESSION: 1. Coronary CTA FFR flow analysis demonstrates gradual degradation in flow from the mid to distal LAD. This is not consistent with focal stenosis and most likely represents small vessel disease.  2.  Recommend medical management.   ASSESSMENT:    1. Essential hypertension   2. CAD in native artery   3. Agatston coronary artery calcium score between 200 and 399   4. Hyperlipidemia with target LDL less than 70   5. Grade II diastolic dysfunction    PLAN:  Steven Holloway is a 74 year old gentleman who has a 20-year history of hypertension as well as a history of hyperlipidemia.  Remotely he had been on Hyzaar but ultimately was switched to amlodipine for which he currently is on 10 mg daily.  He also has a history of hyperlipidemia and is on pravastatin 20 mg.  Dr. Dagmar Hait is his primary physician.  LDL cholesterol in February 2021 was 103.  He  had developed left-sided chest discomfort which initially occurred while he was driving.  However subsequently he has noticed chest tightness particularly if he walks fast up steps associated with shortness of breath.  When I initially saw him, I added metoprolol succinate 25 mg to his medical regimen.  I also recommended titration of pravastatin to 40 mg from his previous dose of 20 mg.  I reviewed his echo Doppler study in detail which shows normal systolic function with mild LVH and grade 2 diastolic dysfunction.  I also thoroughly reviewed his coronary CTA which showed a calcium score of 266 placing him in the 54th percentile for age and sex matched control.  He had moderate concomitant CAD.  FFR analysis was not hemodynamically significant.  There was abnormal FFR in the distal LAD which most likely was not consistent with a focal stenosis but most likely representative  of distal small vessel disease disease.  Presently, the plan is for optimal medical management.  Since he does have coronary calcification and multivessel plaque I have recommended discontinuance of pravastatin and will change him to rosuvastatin 20 mg with target LDL in the 60s or below.  His blood pressure today is stable and he is tolerating combination amlodipine 10 mg and metoprolol succinate 25 mg daily.  He is also on a baby aspirin 81 mg.  He will be seeing Dr. Elsworth Soho for follow-up evaluation.  From a cardiologic perspective if clearance is needed for a future knee replacement, clearance will be given.  I will see him in 6 months for reevaluation or sooner as needed.  Medication Adjustments/Labs and Tests Ordered: Current medicines are reviewed at length with the patient today.  Concerns regarding medicines are outlined above.  Medication changes, Labs and Tests ordered today are listed in the Patient Instructions below. Patient Instructions  Medication Instructions:  STOP taking the pravastatin START taking rosuvastatin 20 mg by mouth once a day *If you need a refill on your cardiac medications before your next appointment, please call your pharmacy*   Lab Work: None ordered If you have labs (blood work) drawn today and your tests are completely normal, you  will receive your results only by:  [place] (if you have MyChart) OR  A paper copy in the mail If you have any lab test that is abnormal or we need to change your treatment, we will call you to review the results.   Testing/Procedures: None ordered   Follow-Up: At [facility], you and your health needs are our priority.  As part of our continuing mission to provide you with exceptional heart care, we have created designated Provider Care Teams.  These Care Teams include your primary Cardiologist (physician) and Advanced Practice Providers (APPs -  Physician Assistants and Nurse Practitioners) who all work together to  provide you with the care you need, when you need it.  We recommend signing up for the patient portal called "MyChart".  Sign up information is provided on this After Visit Summary.  MyChart is used to connect with patients for Virtual Visits (Telemedicine).  Patients are able to view lab/test results, encounter notes, upcoming appointments, etc.  Non-urgent messages can be sent to your provider as well.   To learn more about what you can do with MyChart, go to [email or web address].    Your next appointment:   6 month(s)  The format for your next appointment:   In Person  Provider:   Shelva Majestic, MD  Your physician wants you to follow-up in: 6 months. You will receive a reminder letter in the mail two months in advance. If you don't receive a letter, please call our office to schedule the follow-up appointment.   Other Instructions None     Signed, Shelva Majestic, MD  01/08/2020 4:24 PM    [facility] [address], [place], [place], [place]  [address] Phone: [phone]

## 2020-01-08 ENCOUNTER — Encounter: Payer: Self-pay | Admitting: Cardiovascular Disease

## 2020-01-14 ENCOUNTER — Other Ambulatory Visit: Payer: Self-pay

## 2020-01-14 MED ORDER — ROSUVASTATIN CALCIUM 20 MG PO TABS: 20.0000 mg | ORAL_TABLET | Freq: Every day | ORAL | 3 refills | Status: DC

## 2020-01-14 MED ORDER — METOPROLOL SUCCINATE ER 25 MG PO TB24
25.0000 mg | ORAL_TABLET | Freq: Every day | ORAL | 1 refills | Status: DC
Start: 2020-01-14 — End: 2020-08-04

## 2020-02-13 ENCOUNTER — Telehealth: Payer: Self-pay | Admitting: Cardiovascular Disease

## 2020-02-13 NOTE — Telephone Encounter (Signed)
Referral sent for sleep study, patient already established with Dr. Claiborne Billings. Does he need an appt with him or can we place sleep study?

## 2020-02-18 NOTE — Telephone Encounter (Signed)
I can't see the notes or records attached, so I am unsure if there is documentation of him showing symptoms. Should we just place order?

## 2020-03-19 NOTE — Telephone Encounter (Signed)
This patient will need appointment to document symptoms and the order of the sleep study.

## 2020-08-03 ENCOUNTER — Other Ambulatory Visit: Payer: Self-pay | Admitting: Cardiovascular Disease

## 2020-10-07 ENCOUNTER — Encounter: Payer: Self-pay | Admitting: Internal Medicine

## 2020-11-28 ENCOUNTER — Encounter: Payer: Self-pay | Admitting: Internal Medicine

## 2021-01-15 ENCOUNTER — Telehealth: Payer: Self-pay | Admitting: *Deleted

## 2021-01-15 ENCOUNTER — Other Ambulatory Visit: Payer: Self-pay | Admitting: Cardiovascular Disease

## 2021-01-15 ENCOUNTER — Encounter: Payer: Self-pay | Admitting: *Deleted

## 2021-01-15 DIAGNOSIS — Z9189 Other specified personal risk factors, not elsewhere classified: Secondary | ICD-10-CM | POA: Insufficient documentation

## 2021-01-15 NOTE — Telephone Encounter (Signed)
John,  Please review and advise   marie

## 2021-01-15 NOTE — Telephone Encounter (Signed)
Dr Carlean Purl,  This pt is scheduled for a colon with you on 1-23 Monday. Last colon 2019 in [place] 5 polyps-  in reviewing chart there is a documented difficult intubation 2018 See note- OV or direct at [facility]? Thanks, Lelan Pons  Signed                                      Procedure Name: Intubation Date/Time: 04/22/2016 9:22 AM Performed by: Inda Coke Pre-anesthesia Checklist: Patient identified, Emergency Drugs available, Suction available and Patient being monitored Patient Re-evaluated:Patient Re-evaluated prior to inductionOxygen Delivery Method: Circle System Utilized Preoxygenation: Pre-oxygenation with 100% oxygen Intubation Type: IV induction Ventilation: Mask ventilation without difficulty Laryngoscope Size: Mac and 4 Grade View: Grade III Tube type: Oral Tube size: 7.5 mm Number of attempts: 1 Airway Equipment and Method: Stylet and Oral airway Placement Confirmation: ETT inserted through vocal cords under direct vision,  positive ETCO2 and breath sounds checked- equal and bilateral Secured at: 23 cm Tube secured with: Tape Dental Injury: Teeth and Oropharynx as per pre-operative assessment  Difficulty Due To: Difficult Airway- due to anterior larynx

## 2021-01-20 NOTE — Telephone Encounter (Signed)
noted 

## 2021-02-02 ENCOUNTER — Ambulatory Visit (AMBULATORY_SURGERY_CENTER): Payer: Medicare Other

## 2021-02-02 ENCOUNTER — Other Ambulatory Visit: Payer: Self-pay

## 2021-02-02 VITALS — Ht 68.0 in | Wt 210.0 lb

## 2021-02-02 DIAGNOSIS — Z86010 Personal history of colonic polyps: Secondary | ICD-10-CM

## 2021-02-02 NOTE — Progress Notes (Signed)
Pre visit completed via phone call; Patient verified name, DOB, and address; No egg or soy allergy known to patient  No issues known to pt with past sedation with any surgeries or procedures====Patient denies ever being told they had issues or difficulty with intubation ----patient chart notates the patient was a difficult intubation in 2018, however, Steven Holloway has cleared patient for [name] procedure (see telephone note);   No FH of Malignant Hyperthermia Pt is not on diet pills Pt is not on home 02  Pt is not on blood thinners  Pt denies issues with constipation  No A fib or A flutter Pt is fully vaccinated for Covid x 2 +boosters; NO PA's for preps discussed with pt in [place] today  Discussed with pt there will be an out-of-pocket cost for prep and that varies from $0 to 70 + dollars - pt verbalized understanding  Due to the COVID-19 pandemic we are asking patients to follow certain guidelines in [place] and the [place]   Pt aware of COVID protocols and [facility] guidelines

## 2021-02-10 ENCOUNTER — Encounter: Payer: Self-pay | Admitting: Internal Medicine

## 2021-02-16 ENCOUNTER — Encounter: Payer: Self-pay | Admitting: Internal Medicine

## 2021-02-16 ENCOUNTER — Ambulatory Visit (AMBULATORY_SURGERY_CENTER): Payer: Medicare Other | Admitting: Internal Medicine

## 2021-02-16 VITALS — BP 123/60 | HR 65 | Temp 98.2°F | Resp 14 | Ht 68.0 in | Wt 210.0 lb

## 2021-02-16 DIAGNOSIS — D120 Benign neoplasm of cecum: Secondary | ICD-10-CM

## 2021-02-16 DIAGNOSIS — Z860100 Personal history of colon polyps, unspecified: Secondary | ICD-10-CM

## 2021-02-16 DIAGNOSIS — D124 Benign neoplasm of descending colon: Secondary | ICD-10-CM

## 2021-02-16 DIAGNOSIS — Z860101 Personal history of adenomatous and serrated colon polyps: Secondary | ICD-10-CM

## 2021-02-16 DIAGNOSIS — D122 Benign neoplasm of ascending colon: Secondary | ICD-10-CM

## 2021-02-16 MED ORDER — SODIUM CHLORIDE 0.9 % IV SOLN: 500.0000 mL | Freq: Once | INTRAVENOUS | Status: DC

## 2021-02-16 NOTE — Progress Notes (Signed)
Called to room to assist during endoscopic procedure.  Patient ID and intended procedure confirmed with present staff. Received instructions for my participation in the procedure from the performing physician.  

## 2021-02-16 NOTE — Progress Notes (Signed)
Pt's states no medical or surgical changes since previsit or office visit.  VS CW  

## 2021-02-16 NOTE — Op Note (Signed)
[facility] Patient Name: Steven Holloway Procedure Date: 02/16/2021 10:02 AM MRN: [id number] Endoscopist: Gatha Mayer , MD Age: 76 Referring MD:  Date of Birth: 1945/10/04 Gender: Male Account #: [id number] Procedure:                Colonoscopy Indications:              Surveillance: Personal history of adenomatous                            polyps on last colonoscopy > 3 years ago, Last                            colonoscopy: 2019 Medicines:                Propofol per Anesthesia, Monitored Anesthesia Care Procedure:                Pre-Anesthesia Assessment:                           - Prior to the procedure, a History and Physical                            was performed, and patient medications and                            allergies were reviewed. The patient's tolerance of                            previous anesthesia was also reviewed. The risks                            and benefits of the procedure and the sedation                            options and risks were discussed with the patient.                            All questions were answered, and informed consent                            was obtained. Prior Anticoagulants: The patient has                            taken no previous anticoagulant or antiplatelet                            agents. ASA Grade Assessment: II - A patient with                            mild systemic disease. After reviewing the risks                            and benefits, the patient was deemed in  satisfactory condition to undergo the procedure.                           After obtaining informed consent, the colonoscope                            was passed under direct vision. Throughout the                            procedure, the patient's blood pressure, pulse, and                            oxygen saturations were monitored continuously. The                            CF [id number] #[id number] was  introduced through the anus                            and advanced to the the cecum, identified by                            appendiceal orifice and ileocecal valve. The                            colonoscopy was performed without difficulty. The                            patient tolerated the procedure well. The quality                            of the bowel preparation was good. The ileocecal                            valve, appendiceal orifice, and rectum were                            photographed. The bowel preparation used was                            Miralax via split dose instruction. Scope In: 10:10:43 AM Scope Out: 10:29:41 AM Scope Withdrawal Time: 0 hours 16 minutes 17 seconds  Total Procedure Duration: 0 hours 18 minutes 58 seconds  Findings:                 The perianal and digital rectal examinations were                            normal.                           Six sessile polyps were found in the descending                            colon, ascending colon and cecum. The polyps were 3  to 8 mm in size. These polyps were removed with a                            cold snare. Resection and retrieval were complete.                            Verification of patient identification for the                            specimen was done. Estimated blood loss was minimal.                           The exam was otherwise without abnormality on                            direct and retroflexion views. Complications:            No immediate complications. Estimated Blood Loss:     Estimated blood loss was minimal. Impression:               - Six 3 to 8 mm polyps in the descending colon, in                            the ascending colon and in the cecum, removed with                            a cold snare. Resected and retrieved.                           - The examination was otherwise normal on direct                            and retroflexion  views.                           - Personal history of colonic polyps. 05/2014 2                            adenomas max 10 mm                           06/07/2017 5 polyps removed max 10 mm 3 adenomas and                            2 ssa Recommendation:           - Patient has a contact number available for                            emergencies. The signs and symptoms of potential                            delayed complications were discussed with the  patient. Return to normal activities tomorrow.                            Written discharge instructions were provided to the                            patient.                           - Resume previous diet.                           - Continue present medications.                           - Await pathology results.                           - No recommendation at this time regarding repeat                            colonoscopy due to age. Gatha Mayer, MD 02/16/2021 10:37:27 AM This report has been signed electronically.

## 2021-02-16 NOTE — Progress Notes (Signed)
[facility] History and Physical   Primary Care Physician:  Prince Solian, MD   Reason for Procedure:   Hx colon polyps  Plan:    colonoscopy     HPI: Steven Holloway is a 76 y.o. male here for a surveillance colon exam .  05/2014 2 adenomas max 10 mm  06/07/2017 5  polyps removed max 10 mm 3 adenomas and 2 ssa  Past Medical History:  Diagnosis Date   Anxiety    on meds   Arthritis    generalized   GERD (gastroesophageal reflux disease)    on meds   Hx of adenomatous colonic polyps 05/27/2014   Hyperlipidemia    on meds   Hypertension    on meds   Seasonal allergies     Past Surgical History:  Procedure Laterality Date   APPENDECTOMY  01/25/1978   COLONOSCOPY  2019   CG-MAC-mira(good)-SSP x 2, TA x 3, recall 2022   LUMBAR LAMINECTOMY/DECOMPRESSION MICRODISCECTOMY N/A 04/22/2016   Procedure: Laminectomy and Foraminotomy - Lumbar Two-Three,Lumbar Three-Four,Lumbar Four-Five with sublaminar decompression;  Surgeon: Eustace Moore, MD;  Location: [facility];  Service: Neurosurgery;  Laterality: N/A;    Prior to Admission medications   Medication Sig Start Date End Date Taking? Authorizing Provider  amLODipine (NORVASC) 10 MG tablet Take 10 mg by mouth daily. 09/27/19  Yes [provider]  aspirin EC 81 MG tablet Take 81 mg by mouth at bedtime.    Yes [provider]  CRANBERRY PO Take 1,500 mg by mouth daily.   Yes [provider]  escitalopram (LEXAPRO) 5 MG tablet Take 5 mg by mouth daily.   Yes [provider]  fluticasone (FLONASE) 50 MCG/ACT nasal spray Place 1 spray into both nostrils at bedtime.   Yes [provider]  loratadine (CLARITIN) 10 MG tablet Take 1 tablet by mouth daily at 6 (six) AM.   Yes [provider]  metoprolol succinate (TOPROL-XL) 25 MG 24 hr tablet TAKE 1 TABLET BY MOUTH  DAILY 08/04/20  Yes Troy Sine, MD  montelukast (SINGULAIR) 10 MG tablet Take 10 mg by mouth daily. 11/15/20   Yes [provider]  pantoprazole (PROTONIX) 40 MG tablet pantoprazole 40 mg tablet,delayed release   Yes [provider]  potassium chloride SA (KLOR-CON M) 20 MEQ tablet Take 20 mEq by mouth 2 (two) times daily. 12/04/20  Yes [provider]  pravastatin (PRAVACHOL) 40 MG tablet Take 1 tablet by mouth daily at 6 (six) AM.   Yes [provider]  rosuvastatin (CRESTOR) 20 MG tablet TAKE 1 TABLET BY MOUTH  DAILY 01/16/21  Yes Lorretta Harp, MD  tamsulosin (FLOMAX) 0.4 MG CAPS capsule Take 0.4 mg by mouth daily. 11/20/20  Yes [provider]  valsartan-hydrochlorothiazide (DIOVAN-HCT) 320-12.5 MG tablet Take 1 tablet by mouth daily at 6 (six) AM.   Yes [provider]    Current Outpatient Medications  Medication Sig Dispense Refill   amLODipine (NORVASC) 10 MG tablet Take 10 mg by mouth daily.     aspirin EC 81 MG tablet Take 81 mg by mouth at bedtime.      CRANBERRY PO Take 1,500 mg by mouth daily.     escitalopram (LEXAPRO) 5 MG tablet Take 5 mg by mouth daily.     fluticasone (FLONASE) 50 MCG/ACT nasal spray Place 1 spray into both nostrils at bedtime.     loratadine (CLARITIN) 10 MG tablet Take 1 tablet by mouth daily at  6 (six) AM.     metoprolol succinate (TOPROL-XL) 25 MG 24 hr tablet TAKE 1 TABLET BY MOUTH  DAILY 90 tablet 3   montelukast (SINGULAIR) 10 MG tablet Take 10 mg by mouth daily.     pantoprazole (PROTONIX) 40 MG tablet pantoprazole 40 mg tablet,delayed release     potassium chloride SA (KLOR-CON M) 20 MEQ tablet Take 20 mEq by mouth 2 (two) times daily.     pravastatin (PRAVACHOL) 40 MG tablet Take 1 tablet by mouth daily at 6 (six) AM.     rosuvastatin (CRESTOR) 20 MG tablet TAKE 1 TABLET BY MOUTH  DAILY 30 tablet 0   tamsulosin (FLOMAX) 0.4 MG CAPS capsule Take 0.4 mg by mouth daily.     valsartan-hydrochlorothiazide (DIOVAN-HCT) 320-12.5 MG tablet Take 1 tablet by mouth daily at 6 (six) AM.     Current  Facility-Administered Medications  Medication Dose Route Frequency Provider Last Rate Last Admin   0.9 %  sodium chloride infusion  500 mL Intravenous Once Gatha Mayer, MD        Allergies as of 02/16/2021 - Review Complete 02/16/2021  Allergen Reaction Noted   Sulfa antibiotics Nausea And Vomiting 05/04/2013   Sulfasalazine Nausea And Vomiting 05/04/2013   Ciprofloxacin Nausea And Vomiting 12/30/2020    Family History  Problem Relation Age of Onset   Colon cancer Neg Hx    Esophageal cancer Neg Hx    Rectal cancer Neg Hx    Stomach cancer Neg Hx    Colon polyps Neg Hx     Social History   Socioeconomic History   Marital status: Married    Spouse name: Not on file   Number of children: Not on file   Years of education: Not on file   Highest education level: Not on file  Occupational History   Not on file  Tobacco Use   Smoking status: Former   Smokeless tobacco: Never  Vaping Use   Vaping Use: Never used  Substance and Sexual Activity   Alcohol use: Not Currently    Alcohol/week: 0.0 - 1.0 standard drinks    Comment: occasional   Drug use: No   Review of Systems:  All other review of systems negative except as mentioned in the HPI.  Physical Exam: Vital signs BP (!) 153/56    Pulse 61    Temp 98.2 F (36.8 C) (Temporal)    Ht _0  (1.727 m)    Wt 210 lb (95.3 kg)    SpO2 98%    BMI 31.93 kg/m   General:   Alert,  Well-developed, well-nourished, pleasant and cooperative in NAD Lungs:  Clear throughout to auscultation.   Heart:  Regular rate and rhythm; no murmurs, clicks, rubs,  or gallops. Abdomen:  Soft, nontender and nondistended. Normal bowel sounds.   Neuro/Psych:  Alert and cooperative. Normal mood and affect. A and O x 3   _1  E. Carlean Purl, MD, [facility] [phone] (pager) 02/16/2021 10:01 AM@

## 2021-02-16 NOTE — Patient Instructions (Addendum)
I found and removed 6 small polyps today. All look benign.  I will let you know pathology results and whether to have another routine colonoscopy by mail and/or My Chart.  You are in the age range where we often stop routine testing but given # of polyps and current health status we may discuss in 3 years.  I appreciate the opportunity to care for you. Gatha Mayer, MD, FACG  YOU HAD AN ENDOSCOPIC PROCEDURE TODAY AT [facility]:   Refer to the procedure report that was given to you for any specific questions about what was found during the examination.  If the procedure report does not answer your questions, please call your gastroenterologist to clarify.  If you requested that your care partner not be given the details of your procedure findings, then the procedure report has been included in a sealed envelope for you to review at your convenience later.  YOU SHOULD EXPECT: Some feelings of bloating in the abdomen. Passage of more gas than usual.  Walking can help get rid of the air that was put into your GI tract during the procedure and reduce the bloating. If you had a lower endoscopy (such as a colonoscopy or flexible sigmoidoscopy) you may notice spotting of blood in your stool or on the toilet paper. If you underwent a bowel prep for your procedure, you may not have a normal bowel movement for a few days.  Please Note:  You might notice some irritation and congestion in your nose or some drainage.  This is from the oxygen used during your procedure.  There is no need for concern and it should clear up in a day or so.  SYMPTOMS TO REPORT IMMEDIATELY:  Following lower endoscopy (colonoscopy or flexible sigmoidoscopy):  Excessive amounts of blood in the stool  Significant tenderness or worsening of abdominal pains  Swelling of the abdomen that is new, acute  Fever of 100F or higher  For urgent or emergent issues, a gastroenterologist can be reached at any hour by  calling [phone]. Do not use MyChart messaging for urgent concerns.    DIET:  We do recommend a small meal at first, but then you may proceed to your regular diet.  Drink plenty of fluids but you should avoid alcoholic beverages for 24 hours.  ACTIVITY:  You should plan to take it easy for the rest of today and you should NOT DRIVE or use heavy machinery until tomorrow (because of the sedation medicines used during the test).    FOLLOW UP: Our staff will call the number listed on your records 48-72 hours following your procedure to check on you and address any questions or concerns that you may have regarding the information given to you following your procedure. If we do not reach you, we will leave a message.  We will attempt to reach you two times.  During this call, we will ask if you have developed any symptoms of COVID 19. If you develop any symptoms (ie: fever, flu-like symptoms, shortness of breath, cough etc.) before then, please call [phone].  If you test positive for Covid 19 in the 2 weeks post procedure, please call and report this information to [place].    If any biopsies were taken you will be contacted by phone or by letter within the next 1-3 weeks.  Please call us at [phone] if you have not heard about the biopsies in 3 weeks.    SIGNATURES/CONFIDENTIALITY: You and/or  your care partner have signed paperwork which will be entered into your electronic medical record.  These signatures attest to the fact that that the information above on your After Visit Summary has been reviewed and is understood.  Full responsibility of the confidentiality of this discharge information lies with you and/or your care-partner.

## 2021-02-16 NOTE — Progress Notes (Signed)
Report to PACU, RN, vss, BBS= Clear.  

## 2021-02-18 ENCOUNTER — Telehealth: Payer: Self-pay | Admitting: *Deleted

## 2021-02-18 NOTE — Telephone Encounter (Signed)
Attempted f/u phone call. No answer. Left message. °

## 2021-02-18 NOTE — Telephone Encounter (Signed)
Pt called back and stated he was doing fine.

## 2021-02-18 NOTE — Telephone Encounter (Signed)
°  Follow up Call-  Call back number 02/16/2021  Post procedure Call Back phone  # [phone]  Permission to leave phone message Yes  Some recent data might be hidden     Patient questions:  Do you have a fever, pain , or abdominal swelling? No. Pain Score  0 *  Have you tolerated food without any problems? Yes.    Have you been able to return to your normal activities? Yes.    Do you have any questions about your discharge instructions: Diet   No. Medications  No. Follow up visit  No.  Do you have questions or concerns about your Care? No.  Actions: * If pain score is 4 or above: No action needed, pain <4.  Have you developed a fever since your procedure? no  2.   Have you had an respiratory symptoms (SOB or cough) since your procedure? no  3.   Have you tested positive for COVID 19 since your procedure no  4.   Have you had any family members/close contacts diagnosed with the COVID 19 since your procedure?  no   If yes to any of these questions please route to Joylene John, RN and Joella Prince, RN

## 2021-02-18 NOTE — Telephone Encounter (Signed)
Patient called back and said everything was going well.

## 2021-02-23 ENCOUNTER — Encounter: Payer: Self-pay | Admitting: Internal Medicine

## 2021-02-26 ENCOUNTER — Other Ambulatory Visit: Payer: Self-pay | Admitting: Cardiovascular Disease

## 2021-03-20 ENCOUNTER — Other Ambulatory Visit: Payer: Self-pay | Admitting: Cardiovascular Disease

## 2021-07-10 ENCOUNTER — Other Ambulatory Visit: Payer: Self-pay | Admitting: Cardiovascular Disease

## 2021-09-04 DIAGNOSIS — M76899 Other specified enthesopathies of unspecified lower limb, excluding foot: Secondary | ICD-10-CM | POA: Insufficient documentation

## 2022-03-17 ENCOUNTER — Other Ambulatory Visit: Payer: Self-pay | Admitting: Cardiovascular Disease

## 2022-05-13 ENCOUNTER — Other Ambulatory Visit: Payer: Self-pay | Admitting: Cardiovascular Disease

## 2022-08-05 ENCOUNTER — Other Ambulatory Visit: Payer: Self-pay | Admitting: Cardiovascular Disease

## 2022-08-12 DIAGNOSIS — H6993 Unspecified Eustachian tube disorder, bilateral: Secondary | ICD-10-CM | POA: Insufficient documentation

## 2022-11-16 ENCOUNTER — Other Ambulatory Visit: Payer: Self-pay | Admitting: Cardiovascular Disease

## 2023-11-08 ENCOUNTER — Other Ambulatory Visit (HOSPITAL_COMMUNITY): Payer: Self-pay | Admitting: Adult Health

## 2023-11-08 DIAGNOSIS — I6529 Occlusion and stenosis of unspecified carotid artery: Secondary | ICD-10-CM

## 2023-11-09 ENCOUNTER — Ambulatory Visit (HOSPITAL_COMMUNITY)
Admission: RE | Admit: 2023-11-09 | Discharge: 2023-11-09 | Disposition: A | Discharge status: Home / Self Care | Source: Ambulatory Visit | Attending: Vascular Surgery | Admitting: Vascular Surgery

## 2023-11-09 DIAGNOSIS — I6529 Occlusion and stenosis of unspecified carotid artery: Secondary | ICD-10-CM | POA: Diagnosis not present

## 2023-11-21 ENCOUNTER — Encounter: Payer: Self-pay | Admitting: Neurology

## 2023-11-21 ENCOUNTER — Ambulatory Visit: Admitting: Neurology

## 2023-11-21 VITALS — BP 142/80 | HR 51 | Ht 67.0 in | Wt 219.0 lb

## 2023-11-21 DIAGNOSIS — Z9189 Other specified personal risk factors, not elsewhere classified: Secondary | ICD-10-CM

## 2023-11-21 DIAGNOSIS — J310 Chronic rhinitis: Secondary | ICD-10-CM | POA: Diagnosis not present

## 2023-11-21 DIAGNOSIS — R0683 Snoring: Secondary | ICD-10-CM | POA: Insufficient documentation

## 2023-11-21 DIAGNOSIS — R0681 Apnea, not elsewhere classified: Secondary | ICD-10-CM | POA: Insufficient documentation

## 2023-11-21 DIAGNOSIS — J342 Deviated nasal septum: Secondary | ICD-10-CM | POA: Diagnosis not present

## 2023-11-21 DIAGNOSIS — R002 Palpitations: Secondary | ICD-10-CM | POA: Insufficient documentation

## 2023-11-21 DIAGNOSIS — G8929 Other chronic pain: Secondary | ICD-10-CM | POA: Insufficient documentation

## 2023-11-21 MED ORDER — FLUTICASONE PROPIONATE 50 MCG/ACT NA SUSP: 1.0000 | Freq: Every day | NASAL | 5 refills | Status: AC

## 2023-11-21 NOTE — Patient Instructions (Signed)
 I would like to thank Avva, Ravisankar, MD for allowing me to meet with your patient :   In short, Mr Poli is presenting with vivid dreams, dream enactment  ( no cogwheeling noted ! ) and gait impairment,  witnessed snoring and apnea.   Risk factors for OSA were present,  including : Body mass index is 34.3 kg/m.,  17. 5 neck size and upper airway anatomy.   My Plan is to proceed with: PSG with REM BD montage.  Screening for apnea as well.     I plan to follow up personally or  through our NP within 4-6 months.   A total time of  45  minutes consistent of a part of face to face encounter , exam and interview,  and additional preparation time for chart review was spent .  At today's visit, we discussed treatment options, associated risk and benefits, and engage in counseling as needed including, but not limited to:  Sleep hygiene, Quality Sleep Habits, and Safety concerns for patients with daytime sleepiness who are warned to not operate machinery/ motor vehicles when drowsy.   Risk factors for sleep apnea were identified:   Body mass index is 34.3 kg/m.SABRA

## 2023-11-21 NOTE — Progress Notes (Addendum)
 @[facility]   Provider:  Dedra Gores, MD    Primary Care Physician:  Janey Santos, MD [address] [place] [address]    Referring Provider: Janey Santos, Md [address] [place],  [place] [address]        Chief Concern for this Consultation:   Patient presents with     Patient is here for sleep - difficulty breathing at night, restless.       HPI: I have the pleasure of meeting with Steven Holloway , on 11/21/23 , who is a 78 y.o.  male patient,  seen upon a referral by Dr Avva  for a  Sleep Medicine Consultation.    The patient's referral information stated: Wife reported snoring getting louder and has witnessed apneas.   Rhinitis, non seasonal allergies,  deviated septum and weight gain due to limited exercise - knee and back pain restricted. He is independent in all ADLs.  30 hours a week employed.   Chief concern according to patient:  I like to be able to breathe better and sleep deeper.  Even in my naps I am snoring very loud, and my wife reports I stop breathing at times, after work I need a nap in the afternoons.  My vivid dreams have soemtimes let me to act out.but not recently    Mr Demeyer presented with a medical history of : Past Medical History:  Diagnosis Date   Allergic rhinitis but non seasonal  2018   Anxiety attacks,  2018    on meds   Arthritis, osteo or psoriatic (?)     generalized   Cataract    BILATERAL   Colon polyps    DDD (degenerative disc disease), lumbar,  spinal stenosis/  fusion.     Fatigue K    GERD (gastroesophageal reflux disease)    on meds   Hematuria    History of recurrent UTIs    Hx of adenomatous colonic polyps 05/27/2014   Hyperlipidemia    on meds   Hypertension    on meds - Amlodipine,     IFG (impaired fasting glucose)    Joint disease    Macular degeneration of both eyes    Nephrolithiasis,Kidney stones and microhematuria in  1982    PSA (psoriatic arthritis) (HCC) very limited hand ROM.      Seasonal allergies    Tubular adenoma OSTEOARTHRITIS both Knees.       Sleep relevant medical/ surgical and symptom history: I have swelling, I am fatigued and I snore. My sleep is often interrupted.  ENT surgery or problems: (Sinusitis - frequent ,Vertigo, recent Trauma: I  fell in the shower due to vertigo within the last 3 weeks  - Depression.SABRAanxiety ,  2018.   The patient had no previous sleep evaluations.   Family medical history: There are  biological family members affected by Sleep apnea ( his adult son ).    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 is retired from [employer] .  He lives in a private home, in a household with spouse, the couple has a son and daughter-  no pets in the home  ().   The workplace involves physical activity, outdoor activity, travel.  He transfers cars. Nicotine use: quit 45 years - ago .  ETOH use: / ,  Caffeine intake in form of: Coffee (1 cup in AM ), Soft drinks (social ), Tea ( 1  with dinner) or Energy drinks ( including those containing  taurine ). Caffeine is last consumed at 6 -7.  Exercises not regularly due to knee pain.     Sleep habits and routines are as follows: The patient's dinner time is around 6-7 PM.   The patient goes to bed at, or close to, 10.30- 11 PM. The bedroom is  no longer shared with spouse  , room described as cool, quiet, and dark. The patient reports that it takes < 10 minutes to fall asleep, then continues to sleep for 2 hous and again after 2  hours, uninterrupted or woken up by dreams , and  by the need to void (Nocturia) 1-2 times. .   The preferred sleep position is lateral, with support of 1-2 pillows, (non- adjustable bed/ recliner ). The total estimated sleep time is circa 6-7 hours.  Dreams are reportedly  frequent/ and can be vivid.  Usually he is defending himself form someone in his dreams- he has not fallen out of bed. Dream enactment has been  reported.   6.15  AM is the usual week- day rise time. The patient wakes up spontaneously.  He reports  mostly/ feeling refreshed and restored in the morning, waking with symptoms such as dry mouth, no morning headaches, stiffness or pain( yes) , and fatigue  returns by 10 AM. .  No sleep paralysis has been experienced.  Naps in daytime are taken infrequently (there is a desire to nap and opportunity), lasting from 30 to 40 and have a refreshing quality. These do not interfere with nocturnal sleep.    Review of Systems: Out of a complete 14 system review, the patient complains of only the following symptoms, and all other reviewed systems are negative.:   Loud snorer.  Apnea witnessed- Hypersomnia, some    Joint pain. Snoring,  Sleep fragmentation, 1-2 times Nocturia   How likely are you to doze in the following situations: 0 = not likely, 1 = slight chance, 2 = moderate chance, 3 = high chance Sitting and Reading? Watching Television? Sitting inactive in a public place (theater or meeting)? As a passenger in a car for an hour without a break? Lying down in the afternoon when circumstances permit? Sitting and talking to someone? Sitting quietly after lunch without alcohol? In a car, while stopped for a few minutes in traffic?   Total ESS =11 / 24 points.    FSS endorsed at 39/ 63 points.  GDS: 2/ 15   Social History   Socioeconomic History   Marital status: Married    Spouse name: Not on file   Number of children: Not on file   Years of education: Not on file   Highest education level: Not on file  Occupational History   Not on file  Tobacco Use   Smoking status: Former   Smokeless tobacco: Never  Vaping Use   Vaping status: Never Used  Substance and Sexual Activity   Alcohol use: Not Currently    Alcohol/week: 0.0 - 1.0 standard drinks of alcohol    Comment: occasional   Drug use: No   Sexual activity: Not on file  Other Topics Concern   Not on file  Social  History Narrative   1 cup of coffee in the morning and 1 glass of tea for dinner    Social Drivers of Health   Financial Resource Strain: Not on file  Food Insecurity: Low Risk  (09/20/2022)   Received from [facility]  Hunger Vital Sign    Within the past 12 months, you worried that your food would run out before you got money to buy more: Never true    Within the past 12 months, the food you bought just didn't last and you didn't have money to get more. : Never true  Transportation Needs: No Transportation Needs (09/20/2022)   Received from [facility]   Transportation    In the past 12 months, has lack of reliable transportation kept you from medical appointments, meetings, work or from getting things needed for daily living? : No  Physical Activity: Not on file  Stress: Not on file  Social Connections: Not on file    Family History  Problem Relation Age of Onset   Kidney disease Mother    Dementia Mother    CAD Mother    Hypertension Brother    Migraines Other    Depression Other    Colon cancer Neg Hx    Esophageal cancer Neg Hx    Rectal cancer Neg Hx    Stomach cancer Neg Hx    Colon polyps Neg Hx    Seizures Neg Hx    Stroke Neg Hx    Sleep apnea Neg Hx     Past Medical History:  Diagnosis Date   Allergic rhinitis    Anxiety    on meds   Arthritis    generalized   Cataract    BILATERAL   Colon polyps    DDD (degenerative disc disease), lumbar    Fatigue    GERD (gastroesophageal reflux disease)    on meds   Hematuria    History of recurrent UTIs    Hx of adenomatous colonic polyps 05/27/2014   Hyperlipidemia    on meds   Hypertension    on meds   IFG (impaired fasting glucose)    Joint disease    Macular degeneration of both eyes    Nephrolithiasis    PSA (psoriatic arthritis) (HCC)    Seasonal allergies    Tubular adenoma     Past Surgical History:  Procedure Laterality Date   APPENDECTOMY  01/25/1978   COLONOSCOPY  2019    CG-MAC-mira(good)-SSP x 2, TA x 3, recall 2022   LUMBAR LAMINECTOMY/DECOMPRESSION MICRODISCECTOMY N/A 04/22/2016   Procedure: Laminectomy and Foraminotomy - Lumbar Two-Three,Lumbar Three-Four,Lumbar Four-Five with sublaminar decompression;  Surgeon: Alm GORMAN Molt, MD;  Location: [facility] OR;  Service: Neurosurgery;  Laterality: N/A;     Current Outpatient Medications on File Prior to Visit  Medication Sig Dispense Refill   aspirin  EC 81 MG tablet Take 81 mg by mouth at bedtime.      CRANBERRY PO Take 1,500 mg by mouth daily.     escitalopram (LEXAPRO) 5 MG tablet Take 5 mg by mouth daily.     fluticasone (FLONASE) 50 MCG/ACT nasal spray Place 1 spray into both nostrils at bedtime.     loratadine (CLARITIN) 10 MG tablet Take 1 tablet by mouth daily at 6 (six) AM.     metoprolol  succinate (TOPROL -XL) 25 MG 24 hr tablet TAKE 1 TABLET BY MOUTH ONCE  DAILY 15 tablet 0   montelukast (SINGULAIR) 10 MG tablet Take 10 mg by mouth daily.     pantoprazole (PROTONIX) 40 MG tablet pantoprazole 40 mg tablet,delayed release     potassium chloride  SA (KLOR-CON  M) 20 MEQ tablet Take 20 mEq by mouth 2 (two) times daily.     pravastatin  (PRAVACHOL ) 40 MG tablet Take 1 tablet  by mouth daily at 6 (six) AM.     rosuvastatin  (CRESTOR ) 20 MG tablet TAKE 1 TABLET BY MOUTH DAILY 30 tablet 11   tamsulosin (FLOMAX) 0.4 MG CAPS capsule Take 0.4 mg by mouth daily.     valsartan-hydrochlorothiazide  (DIOVAN-HCT) 320-12.5 MG tablet Take 1 tablet by mouth daily at 6 (six) AM.     amLODipine (NORVASC) 10 MG tablet Take 10 mg by mouth daily. (Patient not taking: Reported on 11/21/2023)     No current facility-administered medications on file prior to visit.    Allergies  Allergen Reactions   Sulfa Antibiotics Nausea And Vomiting   Sulfasalazine Nausea And Vomiting   Ciprofloxacin  Nausea And Vomiting   Other Other (See Comments)    Vitals:   11/21/23 1525  BP: (!) 142/80  Pulse: (!) 51  SpO2: 98%         Physical  exam:   General: The patient was alert and appears not in acute distress.  Mood and affect are appropriate .  The patient's interactions are: Cooperative, makes eye contact, follows the instructions and answers questions coherently.  The patient is groomed and appropriately groomed and dressed. Head: Normocephalic, atraumatic.  Neck is supple. Mallampati: 2.  The neck circumference measured 17.5  inches. Nasal airflow was  not fully patent ,   Overbite / Retrognathia was noted.  Dental status:  upper dentures, partials.  Cardiovascular:  Regular rate and cardiac rhythm by palpable pulse. Respiratory: no audible wheezing, no tachypnoea.   Skin:  Without evidence of ankle edema. No discoloration.  Trunk:  BMI is 34. 3  The patient's posture was slightly stooped.    Neurologic exam : The patient was awake and alert, oriented to place and time.   Attention span & concentration ability appeared normal.  Speech was fluent, without dysarthria, dysphonia or aphasia, and of normal volume.     Cranial nerves:  There was no loss of smell or taste reported  Pupils are round, equal in size and briskly reactive to light.  Funduscopic exam was deferred.  Extraocular movements in vertical and horizontal planes were intact and without nystagmus. (No Diplopia reported). Visual fields by finger perimetry are intact. Hearing was intact to soft voice.    Facial sensation intact to fine touch.  Facial motor strength: Symmetric movement and tongue and uvula move midline.  Neck ROM: rotation, tilt and flexion extension were intact for age and shoulder shrug was symmetrical.    Motor exam:  Symmetric bulk, strength and ROM.   Normal tone without cog- wheeling, and symmetric grip strength. There is arthritis in hands, knees an hips.  Had spinal stenosis in the past    Sensory:  Fine touch and vibration were tested by tuning fork and intact in the right ankle and knee but diminished on the left   Proprioception tested in the upper extremities was normal.   Coordination: The patient reported no problems with button closure and no changes to penmanship.   The Finger-to-nose maneuver was intact without evidence of ataxia, dysmetria or tremor.   Gait and station: Patient could rise unassisted from a seated position, without bracing, and walked without assistive device.  Stance was of wide width. The patient turned with 4 steps.   Toe and heel walk were deferred.  A  limp was noted.  Arm swing was preserved. The patient's gait posture was /stooped.   Deep tendon reflexes: Upper extremities did show symmetric DTRs.  Lower extremity DTRs were deferred due to  reported knee pain  Babinski response was deferred.   I would like to thank Avva, Ravisankar, MD for allowing me to meet with your patient :   ASSESSMENT :  In short, Mr Shinsato is presenting with vivid dreams, dream enactment  ( no cogwheeling noted ! ) and gait impairment,  witnessed snoring and apnea. Risk factors for OSA were present,  including : Body mass index is 34.3 kg/m.,  a 17. 5  neck size and upper airway anatomy. Rhinitis and shortness of breath.   My Plan is to proceed with: PSG with REM BD montage.  Screening for apnea as well.  The attended sleep study can investigate palpitations.  Please continue using  your nasal spray for night time use.   I plan to follow up personally or  through our NP within 4-6 months.   A total time of  45  minutes consistent of a part of face to face encounter , exam and interview,  and additional preparation time for chart review was spent .  At today's visit, we discussed treatment options, associated risk and benefits, and engage in counseling as needed including, but not limited to:  Sleep hygiene, Quality Sleep Habits, and Safety concerns for patients with daytime sleepiness who are warned to not operate machinery/ motor vehicles when drowsy.   Risk factors for sleep apnea were  identified:   Body mass index is 34.3 kg/m..  Additionally, the following were reviewed: Past medical records, past medical and surgical history, family and social background, as well as relevant laboratory results, imaging findings, and medical notes, where applicable.  This note was generated by myself in part by using dictation software, and as a result, it may contain unintentional typos and errors.  Nevertheless, effort was made to accurately convey the pertinent aspects of the patient's visit.   Dedra Gores, MD  [facility] Sleep Board certified in Sleep Medicine by The [employer] and Diplomate of the [employer] (AASM) . Board certified In Neurology, Diplomat of the ABPN,  Fellow of the [employer].

## 2023-11-21 NOTE — Addendum Note (Signed)
 Addended by: CHALICE SAUNAS on: 11/21/2023 04:21 PM   Modules accepted: Orders

## 2023-11-28 ENCOUNTER — Encounter: Payer: Self-pay | Admitting: *Deleted

## 2023-11-30 ENCOUNTER — Ambulatory Visit: Attending: Internal Medicine | Admitting: Internal Medicine

## 2023-11-30 ENCOUNTER — Encounter: Payer: Self-pay | Admitting: Internal Medicine

## 2023-11-30 VITALS — BP 130/70 | HR 58 | Ht 67.0 in | Wt 217.0 lb

## 2023-11-30 DIAGNOSIS — I10 Essential (primary) hypertension: Secondary | ICD-10-CM | POA: Diagnosis not present

## 2023-11-30 DIAGNOSIS — I6523 Occlusion and stenosis of bilateral carotid arteries: Secondary | ICD-10-CM

## 2023-11-30 DIAGNOSIS — R0609 Other forms of dyspnea: Secondary | ICD-10-CM

## 2023-11-30 DIAGNOSIS — R931 Abnormal findings on diagnostic imaging of heart and coronary circulation: Secondary | ICD-10-CM

## 2023-11-30 DIAGNOSIS — Z01818 Encounter for other preprocedural examination: Secondary | ICD-10-CM | POA: Diagnosis not present

## 2023-11-30 DIAGNOSIS — E782 Mixed hyperlipidemia: Secondary | ICD-10-CM

## 2023-11-30 NOTE — Progress Notes (Signed)
 Cardiology Office Note   Date:  11/30/2023  ID:  Steven Holloway, DOB April 19, 1945, MRN [id number] PCP: Janey Santos, MD  [facility] Providers Cardiologist:  Emeline FORBES Calender, MD     History of Present Illness Steven Holloway is a 78 y.o. male who previously followed with Dr. Burnard for chest pain with past medical history of elevated coronary calcium  score (266 in 2021) hypertension and hypertensive CKD 3, hyperlipidemia, orthostatic hypotension, PFO, pedal edema due to amlodipine, mild bilateral carotid artery disease who was referred by Corean Bohr, NP due to concern of increasing dizziness/lightheadedness and 2 episodes of falling/syncope along with a preop eval for knee surgery.    At his with his PCP he was found to have positive orthostasis despite completely stopping amlodipine a month prior and his telmisartan was reduced from 40 mg to 20 mg.  Carotid artery studies were updated and showed mild bilateral carotid disease.  Today, he states he has not had any recurrent symptoms since reducing his telmisartan dose and has been doing well without any recurrent dizziness or syncope.  Patient also presents for preop evaluation for knee surgery which is currently not scheduled and is pending cardiac evaluation.  He reports that he has been having worsening dyspnea on exertion that goes away with rest.  Does not get chest pain.  Due to his knee issues he is not able to ambulate much and his functional status is not well known.  Denies any known stroke or diabetes history.  He has a vascular history as above.  Tobacco use: Remote 45 years ago Alcohol use: Rare Activity level: Limited due to ambulatory issues    ROS:  Review of Systems  All other systems reviewed and are negative.   Physical Exam  Physical Exam Vitals and nursing note reviewed.  Constitutional:      Appearance: Normal appearance.  HENT:     Head: Normocephalic and atraumatic.  Eyes:      Conjunctiva/sclera: Conjunctivae normal.  Neck:     Vascular: No carotid bruit.  Cardiovascular:     Rate and Rhythm: Normal rate and regular rhythm.     Heart sounds: Murmur heard.     Systolic murmur is present with a grade of 2/6.  Pulmonary:     Effort: Pulmonary effort is normal.     Breath sounds: Normal breath sounds.  Musculoskeletal:        General: No swelling or tenderness.  Skin:    Coloration: Skin is not jaundiced or pale.  Neurological:     Mental Status: He is alert.     VS:  BP 130/70 (BP Location: Left Arm, Patient Position: Sitting, Cuff Size: Normal)   Pulse (!) 58   Ht 5' 7 (1.702 m)   Wt 217 lb (98.4 kg)   BMI 33.99 kg/m   Orthostatic VS for the past 24 hrs (Last 3 readings):  BP- Lying Pulse- Lying BP- Sitting Pulse- Sitting BP- Standing at 0 minutes Pulse- Standing at 0 minutes BP- Standing at 3 minutes Pulse- Standing at 3 minutes  11/30/23 0948 120/60 62 110/60 62 110/60 65 126/60 63       Wt Readings from Last 3 Encounters:  11/30/23 217 lb (98.4 kg)  11/21/23 219 lb (99.3 kg)  02/16/21 210 lb (95.3 kg)     EKG Interpretation Date/Time:  Wednesday November 30 2023 09:47:23 EST Ventricular Rate:  58 PR Interval:  118 QRS Duration:  78 QT Interval:  420 QTC Calculation:  412 R Axis:   -15  Text Interpretation: Sinus bradycardia Inferior infarct , age undetermined When compared with ECG of 03-Nov-2019 08:52, No significant change since Confirmed by Kriste Hicks [phone]) on 11/30/2023 9:49:21 AM    Studies Reviewed   Coronary CTA 11/20/2019: Left main: No plaque LAD: Proximal-25 to 49%, moderate mixed plaque in mid LAD 50 to 69% in sequential mild soft plaque with 25 to 49% stenosis.  Mild calcified proximal and D1 of 25 to 49% and sequential severe soft plaque with stenosis of 70 to 99% Ramus: Minimal luminal irregularities LCx: No plaque RCA: Dominant artery.  Proximal 25 to 49%.  Moderate soft plaque in distal RCA of 50 to 69%  stenosis FFR: 1. Coronary CTA FFR flow analysis demonstrates gradual degradation in flow from the mid to distal LAD. This is not consistent with focal stenosis and most likely represents small vessel disease.  2.  Recommend medical management.     Risk Assessment/Calculations             ASCVD risk score: The ASCVD Risk score (Arnett DK, et al., 2019) failed to calculate for the following reasons:   Cannot find a previous HDL lab   Cannot find a previous total cholesterol lab   ASSESSMENT  Preop cardiovascular evaluation for knee surgery  RCRI: 0 however he has known moderate multivessel coronary artery disease in 2021 with unknown METS and worsening shortness of breath on exertion.  Therefore his risk of perioperative MAC is unknown in the setting of the anticipated elevated risk surgery Orthostatic hypotension with falls and syncope, resolved after discontinuation of amlodipine and reduction of telmisartan Hypertension well-controlled Hyperlipidemia Elevated coronary calcium  score Mild bilateral carotid artery disease on aspirin  and statin Coronary artery disease by CTA on aspirin  81 mg, rosuvastatin  20 mg   Plan  Will check a cardiac PET/CT to evaluate for high risk coronary artery disease in this elderly gentleman with unknown mets, known multivessel CAD and worsening symptoms Echocardiogram  Follow up: Pending above workup     Informed Consent   Shared Decision Making/Informed Consent The risks [chest pain, shortness of breath, cardiac arrhythmias, dizziness, blood pressure fluctuations, myocardial infarction, stroke/transient ischemic attack, nausea, vomiting, allergic reaction, radiation exposure, metallic taste sensation and life-threatening complications (estimated to be 1 in 10,000)], benefits (risk stratification, diagnosing coronary artery disease, treatment guidance) and alternatives of a cardiac PET stress test were discussed in detail with Mr. Guastella and he  agrees to proceed.       Signed, Hicks FORBES Kriste, MD

## 2023-11-30 NOTE — Patient Instructions (Signed)
 Medication Instructions:  Your physician recommends that you continue on your current medications as directed. Please refer to the Current Medication list given to you today.  *If you need a refill on your cardiac medications before your next appointment, please call your pharmacy*  Lab Work: NONE If you have labs (blood work) drawn today and your tests are completely normal, you will receive your results only by: MyChart Message (if you have MyChart) OR A paper copy in the mail If you have any lab test that is abnormal or we need to change your treatment, we will call you to review the results.  Testing/Procedures: Echocardiogram  PET CT Stress Test  Follow-Up: At [facility], you and your health needs are our priority.  As part of our continuing mission to provide you with exceptional heart care, our providers are all part of one team.  This team includes your primary Cardiologist (physician) and Advanced Practice Providers or APPs (Physician Assistants and Nurse Practitioners) who all work together to provide you with the care you need, when you need it.  Your next appointment:   After testing  Provider:   Kriste, MD  We recommend signing up for the patient portal called MyChart.  Sign up information is provided on this After Visit Summary.  MyChart is used to connect with patients for Virtual Visits (Telemedicine).  Patients are able to view lab/test results, encounter notes, upcoming appointments, etc.  Non-urgent messages can be sent to your provider as well.   To learn more about what you can do with MyChart, go to [email or web address].   Other Instructions    Please report to Radiology at the [facility] Main Entrance 30 minutes early for your test.  [address] [place], [place] [address]   How to Prepare for Your Cardiac PET/CT Stress Test:  Nothing to eat or drink, except water, 3 hours prior to arrival time.  NO caffeine/decaffeinated  products, or chocolate 12 hours prior to arrival. (Please note decaffeinated beverages (teas/coffees) still contain caffeine).  If you have caffeine within 12 hours prior, the test will need to be rescheduled.  Medication instructions: Do not take erectile dysfunction medications for 72 hours prior to test (sildenafil, tadalafil) Do not take nitrates (isosorbide mononitrate, Ranexa) the day before or day of test Do not take tamsulosin the day before or morning of test Hold theophylline containing medications for 12 hours. Hold Dipyridamole 48 hours prior to the test.  Diabetic Preparation: If able to eat breakfast prior to 3 hour fasting, you may take all medications, including your insulin. Do not worry if you miss your breakfast dose of insulin - start at your next meal. If you do not eat prior to 3 hour fast-Hold all diabetes (oral and insulin) medications. Patients who wear a continuous glucose monitor MUST remove the device prior to scanning.  You may take your remaining medications with water.  NO perfume, cologne or lotion on chest or abdomen area. FEMALES - Please avoid wearing dresses to this appointment.  Total time is 1 to 2 hours; you may want to bring reading material for the waiting time.  IF YOU THINK YOU MAY BE PREGNANT, OR ARE NURSING PLEASE INFORM THE TECHNOLOGIST.  In preparation for your appointment, medication and supplies will be purchased.  Appointment availability is limited, so if you need to cancel or reschedule, please call the Radiology Department Scheduler at [phone] 24 hours in advance to avoid a cancellation fee of $100.00  What  to Expect When you Arrive:  Once you arrive and check in for your appointment, you will be taken to a preparation room within the Radiology Department.  A technologist or Nurse will obtain your medical history, verify that you are correctly prepped for the exam, and explain the procedure.  Afterwards, an IV will be started in  your arm and electrodes will be placed on your skin for EKG monitoring during the stress portion of the exam. Then you will be escorted to the PET/CT scanner.  There, staff will get you positioned on the scanner and obtain a blood pressure and EKG.  During the exam, you will continue to be connected to the EKG and blood pressure machines.  A small, safe amount of a radioactive tracer will be injected in your IV to obtain a series of pictures of your heart along with an injection of a stress agent.    After your Exam:  It is recommended that you eat a meal and drink a caffeinated beverage to counter act any effects of the stress agent.  Drink plenty of fluids for the remainder of the day and urinate frequently for the first couple of hours after the exam.  Your doctor will inform you of your test results within 7-10 business days.  For more information and frequently asked questions, please visit our website: [email or web address]  For questions about your test or how to prepare for your test, please call: Cardiac Imaging Nurse Navigators Office: [phone]

## 2023-12-02 ENCOUNTER — Encounter (HOSPITAL_COMMUNITY): Payer: Self-pay

## 2023-12-06 ENCOUNTER — Encounter (HOSPITAL_COMMUNITY)
Admission: RE | Admit: 2023-12-06 | Discharge: 2023-12-06 | Disposition: A | Discharge status: Home / Self Care | Source: Ambulatory Visit | Attending: Internal Medicine | Admitting: Internal Medicine

## 2023-12-06 DIAGNOSIS — I2510 Atherosclerotic heart disease of native coronary artery without angina pectoris: Secondary | ICD-10-CM

## 2023-12-06 DIAGNOSIS — Z01818 Encounter for other preprocedural examination: Secondary | ICD-10-CM | POA: Diagnosis present

## 2023-12-06 DIAGNOSIS — R931 Abnormal findings on diagnostic imaging of heart and coronary circulation: Secondary | ICD-10-CM | POA: Diagnosis not present

## 2023-12-06 DIAGNOSIS — M47814 Spondylosis without myelopathy or radiculopathy, thoracic region: Secondary | ICD-10-CM | POA: Insufficient documentation

## 2023-12-06 DIAGNOSIS — I700 Atherosclerosis of aorta: Secondary | ICD-10-CM | POA: Insufficient documentation

## 2023-12-06 LAB — NM PET CT CARDIAC PERFUSION MULTI W/ABSOLUTE BLOODFLOW
LV dias vol: 120 mL (ref 62–150)
LV sys vol: 51 mL (ref 4.2–5.8)
MBFR: 1.85
Nuc Rest EF: 58 %
Rest MBF: 0.73 ml/g/min
Rest Nuclear Isotope Dose: 25.4 mCi
ST Depression (mm): 0 mm
Stress MBF: 1.35 ml/g/min
Stress Nuclear Isotope Dose: 25.4 mCi

## 2023-12-06 MED ORDER — RUBIDIUM RB82 GENERATOR (RUBYFILL): 25.4000 | PACK | Freq: Once | INTRAVENOUS | Status: DC

## 2023-12-06 MED ORDER — REGADENOSON 0.4 MG/5ML IV SOLN
0.4000 mg | Freq: Once | INTRAVENOUS | Status: AC
  Administered 2023-12-06: 0.4 mg via INTRAVENOUS

## 2023-12-06 MED ORDER — REGADENOSON 0.4 MG/5ML IV SOLN
INTRAVENOUS | Status: AC
  Filled 2023-12-06: qty 5

## 2023-12-06 MED ORDER — CAFFEINE CITRATE BASE COMPONENT 10 MG/ML IV SOLN
INTRAVENOUS | Status: AC
  Filled 2023-12-06: qty 3

## 2023-12-06 NOTE — Progress Notes (Signed)
 Tolerate scan well.  Did experience Shortness of breath at beginning of test, that resolved

## 2023-12-07 ENCOUNTER — Ambulatory Visit: Payer: Self-pay | Admitting: Internal Medicine

## 2023-12-07 ENCOUNTER — Ambulatory Visit (HOSPITAL_COMMUNITY)
Admission: RE | Admit: 2023-12-07 | Discharge: 2023-12-07 | Disposition: A | Discharge status: Home / Self Care | Source: Ambulatory Visit | Attending: Cardiovascular Disease | Admitting: Cardiovascular Disease

## 2023-12-07 DIAGNOSIS — R0609 Other forms of dyspnea: Secondary | ICD-10-CM | POA: Diagnosis present

## 2023-12-07 LAB — ECHOCARDIOGRAM COMPLETE
Area-P 1/2: 4.99 cm2
P 1/2 time: 814 ms
S' Lateral: 3.5 cm

## 2023-12-07 NOTE — Progress Notes (Signed)
 PET study shows: Medium defect with mild reduction in uptake present in the apical to mid inferolateral and lateral location(s) that is reversible. There is normal wall motion in the defect area. Consistent with ischemia.  This study is low risk  Echocardiogram shows normal ejection fraction with grade 2 diastolic dysfunction and evidence of increased left atrial pressure.  Mild aortic regurgitation.  He has known moderate CAD by coronary CTA in 2021.  These findings suggest stability over time compared to his prior CT results and there are no high risk coronary findings.    Given these findings and after discussion with the interventional cardiologist, there is no further preop cardiac workup needed.  He is considered low to intermediate perioperative cardiovascular risk for the anticipated intermediate risk orthopedic procedure.  *If he still having shortness of breath we can trial Lasix 20 mg for 2 days however he will need to be mindful to get up slowly as he does have a history of orthostasis *Please let the patient know of the results and fax to the orthopedic surgeon  Thank you, Emeline Calender, DO

## 2023-12-12 ENCOUNTER — Telehealth: Payer: Self-pay | Admitting: Internal Medicine

## 2023-12-12 NOTE — Telephone Encounter (Signed)
 Wife is calling in about his results. Please advise

## 2023-12-12 NOTE — Telephone Encounter (Signed)
 PET study shows: Medium defect with mild reduction in uptake present in the apical to mid inferolateral and lateral location(s) that is reversible. There is normal wall motion in the defect area. Consistent with ischemia.   This study is low risk   Echocardiogram shows normal ejection fraction with grade 2 diastolic dysfunction and evidence of increased left atrial pressure.  Mild aortic regurgitation.   He has known moderate CAD by coronary CTA in 2021.  These findings suggest stability over time compared to his prior CT results and there are no high risk coronary findings.     Given these findings and after discussion with the interventional cardiologist, there is no further preop cardiac workup needed.  He is considered low to intermediate perioperative cardiovascular risk for the anticipated intermediate risk orthopedic procedure.   *If he still having shortness of breath we can trial Lasix 20 mg for 2 days however he will need to be mindful to get up slowly as he does have a history of orthostasis *Please let the patient know of the results and fax to the orthopedic surgeon   Thank you, Steven Calender, DO        I spoke with the patient's wife and gave the results for the PET scan and Echo that Dr. Calender provided. I will send these results and notes over to the patient's orthopedic surgeon as well.

## 2023-12-13 ENCOUNTER — Telehealth (HOSPITAL_BASED_OUTPATIENT_CLINIC_OR_DEPARTMENT_OTHER): Payer: Self-pay | Admitting: *Deleted

## 2023-12-13 NOTE — Telephone Encounter (Signed)
   Pre-operative Risk Assessment    Patient Name: Steven Holloway  DOB: November 29, 1945 MRN: [id number]   Date of last office visit: 11/30/2023 Date of next office visit: 01/23/2024  Request for Surgical Clearance    Procedure:  Left total knee arthroplasty  Date of Surgery:  Clearance TBD                                 Surgeon:  Dr. Redell Shoals Surgeon's Group or Practice Name:  [facility] Phone number:  [phone] Fax number:  [phone]   Type of Clearance Requested:   - Medical  - Pharmacy:  Hold Aspirin  Not Indicated   Type of Anesthesia:  Spinal   Additional requests/questions:    Signed, Edsel Grayce Sanders   12/13/2023, 1:19 PM

## 2023-12-13 NOTE — Telephone Encounter (Signed)
 Patient's wife is following up. She says Dr. Joanie office did not receive results or clearance recommendation for procedure. She would like to know if this can be faxed to them again + a call back to confirm.

## 2023-12-13 NOTE — Telephone Encounter (Signed)
   Name: Steven Holloway  DOB: July 21, 1945  MRN: [id number]   Primary Cardiologist: Emeline FORBES Calender, MD  Chart reviewed as part of pre-operative protocol coverage.   Per Dr. Calender: He has known moderate CAD by coronary CTA in 2021.  These findings suggest stability over time compared to his prior CT results and there are no high risk coronary findings.  Pt underwent nuclear PET stress, which was low risk.   Given these findings and after discussion with the interventional cardiologist, there is no further preop cardiac workup needed.  He is considered low to intermediate perioperative cardiovascular risk for the anticipated intermediate risk orthopedic procedure.   Therefore, based on ACC/AHA guidelines, the patient would be at acceptable risk for the planned procedure without further cardiovascular testing.   May hold ASA if needed 5-7 days.  I will route this recommendation to the requesting party via Epic fax function and remove from pre-op pool. Please call with questions.  Jon Garre Primitivo Merkey, PA 12/13/2023, 1:56 PM

## 2023-12-13 NOTE — Telephone Encounter (Signed)
 Re-sent testing report to Dr. Joanie office via epic fax. Updated patient via MyChart

## 2023-12-13 NOTE — Telephone Encounter (Signed)
 Office havent received clearance ask that it be fax to [phone]

## 2023-12-14 ENCOUNTER — Ambulatory Visit: Admitting: Cardiology

## 2023-12-14 NOTE — Telephone Encounter (Signed)
 Will re-fax clearance to [phone].

## 2023-12-19 ENCOUNTER — Telehealth: Payer: Self-pay | Admitting: Neurology

## 2023-12-19 NOTE — Telephone Encounter (Signed)
 12/19/23 sent mychart EE  11/22/23 [employer] medicare no auth req EE

## 2023-12-19 NOTE — Telephone Encounter (Signed)
 Patient saw [facility] sleep Mychart apt in January. Would like a call back regarding this apt.

## 2023-12-19 NOTE — Telephone Encounter (Signed)
 I called the patient to check on his upcoming surgery and if there was any other paperwork that we needed to send over to his orthopedic surgeon. I double sent over the PET scan results to be sure and we will be available if they need anything from a cardiac stand point.

## 2023-12-20 NOTE — Telephone Encounter (Signed)
 Noted, thank you please see phone note from yesterday with Sleep study.

## 2023-12-20 NOTE — Telephone Encounter (Signed)
 I spoke with the patient. He is scheduled at [facility] for 02/01/2024 at 8 pm,  Mailed packet and sent mychart.  [employer] medic[employer] no auth req.

## 2023-12-21 ENCOUNTER — Other Ambulatory Visit (HOSPITAL_COMMUNITY)

## 2024-01-23 ENCOUNTER — Encounter: Payer: Self-pay | Admitting: Internal Medicine

## 2024-01-23 ENCOUNTER — Ambulatory Visit: Attending: Internal Medicine | Admitting: Internal Medicine

## 2024-01-23 VITALS — BP 160/70 | HR 70 | Ht 68.0 in | Wt 217.8 lb

## 2024-01-23 DIAGNOSIS — Z01810 Encounter for preprocedural cardiovascular examination: Secondary | ICD-10-CM | POA: Diagnosis not present

## 2024-01-23 DIAGNOSIS — I6523 Occlusion and stenosis of bilateral carotid arteries: Secondary | ICD-10-CM | POA: Diagnosis not present

## 2024-01-23 DIAGNOSIS — E782 Mixed hyperlipidemia: Secondary | ICD-10-CM

## 2024-01-23 NOTE — Progress Notes (Addendum)
 " Cardiology Office Note   Date:  01/23/2024  ID:  Steven Holloway, DOB 06-04-1945, MRN [id number] PCP: Steven Santos, MD  [facility] Providers Cardiologist:  Steven FORBES Calender, DO     History of Present Illness Steven Holloway is a 78 y.o. male who previously followed with Dr. Burnard for chest pain with past medical history of elevated coronary calcium  score (266 in 2021) hypertension and hypertensive CKD 3, hyperlipidemia, orthostatic hypotension, PFO, pedal edema due to amlodipine, mild bilateral carotid artery disease who was initially referred by Steven Bohr, NP due to concern of increasing dizziness/lightheadedness and 2 episodes of falling/syncope along with a preop eval for knee surgery.    At his last visit with me on 11/30/2023 he presented for preop evaluation for knee surgery.  He reported worsening dyspnea on exertion that goes away with rest without chest pain.  Had poor ambulation due to his chronic knee issues.  He was sent for a cardiac PET/CT which showed a medium defect with mild reduction in uptake in the apical to mid inferior lateral and lateral locations that was reversible consistent with ischemia and normal wall motion.  It was noted to be low risk and no further preop cardiac evaluation was needed.  His surgery is now scheduled for 02/09/2024 with Dr. Fidel.  He presents today for follow-up.  He says his dyspnea with exertion is very minimal and essentially resolved since last time I saw him.  He does not get any exertional chest pain.  He is able to ambulate for about 15 minutes before his knees give him problems but does not have any exertional symptoms with that.  Regarding his blood pressure, he says it is typically very well-controlled at home but he was rushing to the office today and was not sure if he would make it on time.   ROS:  Review of Systems  All other systems reviewed and are negative.   Physical Exam  Physical Exam Vitals and nursing  note reviewed.  Constitutional:      Appearance: Normal appearance.  HENT:     Head: Normocephalic and atraumatic.  Eyes:     Conjunctiva/sclera: Conjunctivae normal.  Cardiovascular:     Rate and Rhythm: Normal rate and regular rhythm.  Pulmonary:     Effort: Pulmonary effort is normal.     Breath sounds: Normal breath sounds.  Musculoskeletal:     Comments: Trace lower extremity edema  Skin:    Coloration: Skin is not jaundiced or pale.  Neurological:     Mental Status: He is alert.     VS:  BP (!) 160/70   Pulse 70   Ht 5' 8 (1.727 m)   Wt 217 lb 12.8 oz (98.8 kg)   SpO2 97%   BMI 33.12 kg/m          Wt Readings from Last 3 Encounters:  01/23/24 217 lb 12.8 oz (98.8 kg)  11/30/23 217 lb (98.4 kg)  11/21/23 219 lb (99.3 kg)     EKG Interpretation Date/Time:    Ventricular Rate:    PR Interval:    QRS Duration:    QT Interval:    QTC Calculation:   R Axis:      Text Interpretation:      Studies Reviewed   PET/CT 12/06/2023:    LV perfusion is abnormal. There is evidence of ischemia. Defect 1: There is a medium defect with mild reduction in uptake present in the apical to mid  inferolateral and lateral location(s) that is reversible. There is normal wall motion in the defect area. Consistent with ischemia.   Rest EF: 58%. Stress left ventricular function is normal. End diastolic cavity size is normal. End systolic cavity size is normal.   Myocardial blood flow was computed to be 0.97ml/g/min at rest and 1.35ml/g/min at stress. Global myocardial blood flow reserve was 1.85 and was abnormal.   Coronary calcium  was present on the attenuation correction CT images. Moderate coronary calcifications were present. Coronary calcifications were present in the left anterior descending artery, left circumflex artery and right coronary artery distribution(s).   Findings are consistent with ischemia. The study is low risk.   Reserve in area of ischemia  1.66  Echocardiogram 12/07/2023:   1. Left ventricular ejection fraction, by estimation, is 55 to 60%. The  left ventricle has normal function. The left ventricle has no regional  wall motion abnormalities. Left ventricular diastolic parameters are  consistent with Grade II diastolic  dysfunction (pseudonormalization).   2. Right ventricular systolic function is normal. The right ventricular  size is normal.   3. Left atrial size was moderately dilated.   4. The mitral valve is normal in structure. No evidence of mitral valve  regurgitation. No evidence of mitral stenosis.   5. The aortic valve is calcified. There is mild calcification of the  aortic valve. Aortic valve regurgitation is mild. No aortic stenosis is  present.   6. The inferior vena cava is normal in size with greater than 50%  respiratory variability, suggesting right atrial pressure of 3 mmHg.   Comparison(s): No significant change from prior study.    Coronary CTA 11/20/2019: Left main: No plaque LAD: Proximal-25 to 49%, moderate mixed plaque in mid LAD 50 to 69% in sequential mild soft plaque with 25 to 49% stenosis.  Mild calcified proximal and D1 of 25 to 49% and sequential severe soft plaque with stenosis of 70 to 99% Ramus: Minimal luminal irregularities LCx: No plaque RCA: Dominant artery.  Proximal 25 to 49%.  Moderate soft plaque in distal RCA of 50 to 69% stenosis FFR: 1. Coronary CTA FFR flow analysis demonstrates gradual degradation in flow from the mid to distal LAD. This is not consistent with focal stenosis and most likely represents small vessel disease.  2.  Recommend medical management.     Risk Assessment/Calculations    ASCVD risk score: The ASCVD Risk score (Arnett DK, et al., 2019) failed to calculate for the following reasons:   Cannot find a previous HDL lab   Cannot find a previous total cholesterol lab   * - Cholesterol units were assumed    ASSESSMENT  Preop cardiovascular  evaluation for knee surgery  RCRI: 1 (ischemic heart disease) with low risk and stable cardiac PET/CT findings as above.  Not on insulin, no history of stroke. Orthostatic hypotension with falls and syncope, resolved after discontinuation of amlodipine and reduction of telmisartan Hypertension BP 160/70 but he was rushing into the office today.  Reports well-controlled numbers with systolics in the 120s at home Hyperlipidemia on rosuvastatin  20 mg Elevated coronary calcium  score Mild bilateral carotid artery disease on aspirin  and statin Coronary artery disease by CTA on aspirin  81 mg, rosuvastatin  20 mg   Plan  Patient is a low to intermediate perioperative cardiac risk of MACE for the anticipated intermediate risk procedure.  No further cardiac workup needed.  Patient may hold his aspirin  preoperatively if needed but should restart as soon as possible. PCP is  managing hypertension and hyperlipidemia  Follow up: 6 months with APP and myself in 1 year    Signed, Steven FORBES Calender, DO   "

## 2024-01-23 NOTE — Patient Instructions (Signed)
 Medication Instructions:  Your physician recommends that you continue on your current medications as directed. Please refer to the Current Medication list given to you today.  *If you need a refill on your cardiac medications before your next appointment, please call your pharmacy*  Lab Work: None.  If you have labs (blood work) drawn today and your tests are completely normal, you will receive your results only by: MyChart Message (if you have MyChart) OR A paper copy in the mail If you have any lab test that is abnormal or we need to change your treatment, we will call you to review the results.  Testing/Procedures: None.  Follow-Up: At [facility], you and your health needs are our priority.  As part of our continuing mission to provide you with exceptional heart care, our providers are all part of one team.  This team includes your primary Cardiologist (physician) and Advanced Practice Providers or APPs (Physician Assistants and Nurse Practitioners) who all work together to provide you with the care you need, when you need it.  Your next appointment:   6 month(s)  Provider:   Josefa Beauvais, NP, Orren Fabry, PA-C, Jon Hails, PA-C, Dayna Dunn, PA-C, Madison Fountain, NP, Callie Goodrich, PA-C, Sheng Haley, PA-C, Hao Meng, PA-C, Damien Braver, NP, or Glendia Ferrier, PA-C         Then, Jared E Segal, DO will plan to see you again in 1 year(s).

## 2024-01-24 NOTE — Progress Notes (Signed)
 Sent message, via epic in basket, requesting orders in epic from Careers adviser.

## 2024-01-25 ENCOUNTER — Encounter (HOSPITAL_COMMUNITY): Payer: Self-pay

## 2024-01-25 NOTE — Patient Instructions (Addendum)
 SURGICAL WAITING ROOM VISITATION  Patients having surgery or a procedure may have no more than 2 support people in the waiting area - these visitors may rotate.    Children ages 29 and under will not be able to visit patients in [facility] under most circumstances.   Visitors with respiratory illnesses are discouraged from visiting and should remain at home.  If the patient needs to stay at the hospital during part of their recovery, the visitor guidelines for inpatient rooms apply. Pre-op nurse will coordinate an appropriate time for 1 support person to accompany patient in pre-op.  This support person may not rotate.    Please refer to the [facility] website for the visitor guidelines for Inpatients (after your surgery is over and you are in a regular room).       Your procedure is scheduled on: 02-09-23   Report to [facility] Main Entrance    Report to admitting at       0830  AM   Call this number if you have problems the morning of surgery [phone]   Do not eat food :After Midnight.   After Midnight you may have the following liquids until __0800____ AM/  DAY OF SURGERY  then nothing by mouth  Water Non-Citrus Juices (without pulp, NO RED-Apple, White grape, White cranberry) Black Coffee (NO MILK/CREAM OR CREAMERS, sugar ok)  Clear Tea (NO MILK/CREAM OR CREAMERS, sugar ok) regular and decaf                             Plain Jell-O (NO RED)                                           Fruit ices (not with fruit pulp, NO RED)                                     Popsicles (NO RED)                                                               Sports drinks like Gatorade (NO RED)              .        The day of surgery:  Drink ONE (1) Pre-Surgery Clear Ensure BY 0800  AM the morning of surgery. Drink in one sitting. Do not sip.  This drink was given to you during your hospital  pre-op appointment visit. Nothing else to drink after completing the   Pre-Surgery Clear Ensure .          If you have questions, please contact your surgeons office.   FOLLOW ANY ADDITIONAL PRE OP INSTRUCTIONS YOU RECEIVED FROM YOUR SURGEON'S OFFICE!!!     Oral Hygiene is also important to reduce your risk of infection.                                    Remember - BRUSH YOUR TEETH  THE MORNING OF SURGERY WITH YOUR REGULAR TOOTHPASTE  DENTURES WILL BE REMOVED PRIOR TO SURGERY PLEASE DO NOT APPLY Poly grip OR ADHESIVES!!!   Do NOT smoke after Midnight   Stop all vitamins and herbal supplements 7 days before surgery.   Take these medicines the morning of surgery with A SIP OF WATER: rosuvastatin , metoprolol , lexapro    Bring CPAP mask and tubing day of surgery.                              You may not have any metal on your body including hair pins, jewelry, and body piercing             Do not wear  lotions, powders,/cologne, or deodorant  .               Men may shave face and neck.   Do not bring valuables to the hospital. [facility] IS NOT             RESPONSIBLE   FOR VALUABLES.   Contacts, glasses, dentures or bridgework may not be worn into surgery.   Bring small overnight bag day of surgery.   DO NOT BRING YOUR HOME MEDICATIONS TO THE HOSPITAL. PHARMACY WILL DISPENSE MEDICATIONS LISTED ON YOUR MEDICATION LIST TO YOU DURING YOUR ADMISSION IN THE HOSPITAL!    Patients discharged on the day of surgery will not be allowed to drive home.  Someone NEEDS to stay with you for the first 24 hours after anesthesia.   Special Instructions: Bring a copy of your healthcare power of attorney and living will documents the day of surgery if you haven't scanned them before.              Please read over the following fact sheets you were given: IF YOU HAVE QUESTIONS ABOUT YOUR PRE-OP INSTRUCTIONS PLEASE CALL [phone].  If you test positive for Covid or have been in contact with anyone that has tested positive in the last 10 days please notify  you surgeon.      Pre-operative 4 CHG Bath Instructions  DYNA-Hex 4 Chlorhexidine  Gluconate 4% Solution Antiseptic 4 fl. oz   You can play a key role in reducing the risk of infection after surgery. Your skin needs to be as free of germs as possible. You can reduce the number of germs on your skin by washing with CHG (chlorhexidine  gluconate) soap before surgery. CHG is an antiseptic soap that kills germs and continues to kill germs even after washing.   DO NOT use if you have an allergy to chlorhexidine /CHG or antibacterial soaps. If your skin becomes reddened or irritated, stop using the CHG and notify one of our RNs at   Please shower with the CHG soap starting 4 days before surgery using the following schedule:     Please keep in mind the following:  DO NOT shave, including legs and underarms, starting the day of your first shower.   You may shave your face at any point before/day of surgery.  Place clean sheets on your bed the day you start using CHG soap. Use a clean washcloth (not used since being washed) for each shower. DO NOT sleep with pets once you start using the CHG.  CHG Shower Instructions:  If you choose to wash your hair and private area, wash first with your normal shampoo/soap.  After you use shampoo/soap, rinse your hair and body thoroughly to remove  shampoo/soap residue.  Turn the water OFF and apply about 3 tablespoons (45 ml) of CHG soap to a CLEAN washcloth.  Apply CHG soap ONLY FROM YOUR NECK DOWN TO YOUR TOES (washing for 3-5 minutes)  DO NOT use CHG soap on face, private areas, open wounds, or sores.  Pay special attention to the area where your surgery is being performed.  If you are having back surgery, having someone wash your back for you may be helpful. Wait 2 minutes after CHG soap is applied, then you may rinse off the CHG soap.  Pat dry with a clean towel  Put on clean clothes/pajamas   If you choose to wear lotion, please use ONLY the  CHG-compatible lotions on the back of this paper.     Additional instructions for the day of surgery: DO NOT APPLY any lotions, deodorants, cologne, or perfumes.   Put on clean/comfortable clothes.  Brush your teeth.  Ask your nurse before applying any prescription medications to the skin.   CHG Compatible Lotions   Aveeno Moisturizing lotion  Cetaphil Moisturizing Cream  Cetaphil Moisturizing Lotion  Clairol Herbal Essence Moisturizing Lotion, Dry Skin  Clairol Herbal Essence Moisturizing Lotion, Extra Dry Skin  Clairol Herbal Essence Moisturizing Lotion, Normal Skin  Curel Age Defying Therapeutic Moisturizing Lotion with Alpha Hydroxy  Curel Extreme Care Body Lotion  Curel Soothing Hands Moisturizing Hand Lotion  Curel Therapeutic Moisturizing Cream, Fragrance-Free  Curel Therapeutic Moisturizing Lotion, Fragrance-Free  Curel Therapeutic Moisturizing Lotion, Original Formula  Eucerin Daily Replenishing Lotion  Eucerin Dry Skin Therapy Plus Alpha Hydroxy Crme  Eucerin Dry Skin Therapy Plus Alpha Hydroxy Lotion  Eucerin Original Crme  Eucerin Original Lotion  Eucerin Plus Crme Eucerin Plus Lotion  Eucerin TriLipid Replenishing Lotion  Keri Anti-Bacterial Hand Lotion  Keri Deep Conditioning Original Lotion Dry Skin Formula Softly Scented  Keri Deep Conditioning Original Lotion, Fragrance Free Sensitive Skin Formula  Keri Lotion Fast Absorbing Fragrance Free Sensitive Skin Formula  Keri Lotion Fast Absorbing Softly Scented Dry Skin Formula  Keri Original Lotion  Keri Skin Renewal Lotion Keri Silky Smooth Lotion  Keri Silky Smooth Sensitive Skin Lotion  Nivea Body Creamy Conditioning Oil  Nivea Body Extra Enriched Lotion  Nivea Body Original Lotion  Nivea Body Sheer Moisturizing Lotion Nivea Crme  Nivea Skin Firming Lotion  NutraDerm 30 Skin Lotion  NutraDerm Skin Lotion  NutraDerm Therapeutic Skin Cream  NutraDerm Therapeutic Skin Lotion  ProShield Protective Hand  Cream   Incentive Spirometer  An incentive spirometer is a tool that can help keep your lungs clear and active. This tool measures how well you are filling your lungs with each breath. Taking long deep breaths may help reverse or decrease the chance of developing breathing (pulmonary) problems (especially infection) following: A long period of time when you are unable to move or be active. BEFORE THE PROCEDURE  If the spirometer includes an indicator to show your best effort, your nurse or respiratory therapist will set it to a desired goal. If possible, sit up straight or lean slightly forward. Try not to slouch. Hold the incentive spirometer in an upright position. INSTRUCTIONS FOR USE  Sit on the edge of your bed if possible, or sit up as far as you can in bed or on a chair. Hold the incentive spirometer in an upright position. Breathe out normally. Place the mouthpiece in your mouth and seal your lips tightly around it. Breathe in slowly and as deeply as possible, raising the piston or the  ball toward the top of the column. Hold your breath for 3-5 seconds or for as long as possible. Allow the piston or ball to fall to the bottom of the column. Remove the mouthpiece from your mouth and breathe out normally. Rest for a few seconds and repeat Steps 1 through 7 at least 10 times every 1-2 hours when you are awake. Take your time and take a few normal breaths between deep breaths. The spirometer may include an indicator to show your best effort. Use the indicator as a goal to work toward during each repetition. After each set of 10 deep breaths, practice coughing to be sure your lungs are clear. If you have an incision (the cut made at the time of surgery), support your incision when coughing by placing a pillow or rolled up towels firmly against it. Once you are able to get out of bed, walk around indoors and cough well. You may stop using the incentive spirometer when instructed by your  caregiver.  RISKS AND COMPLICATIONS Take your time so you do not get dizzy or light-headed. If you are in pain, you may need to take or ask for pain medication before doing incentive spirometry. It is harder to take a deep breath if you are having pain. AFTER USE Rest and breathe slowly and easily. It can be helpful to keep track of a log of your progress. Your caregiver can provide you with a simple table to help with this. If you are using the spirometer at home, follow these instructions: SEEK MEDICAL CARE IF:  You are having difficultly using the spirometer. You have trouble using the spirometer as often as instructed. Your pain medication is not giving enough relief while using the spirometer. You develop fever of 100.5 F (38.1 C) or higher. SEEK IMMEDIATE MEDICAL CARE IF:  You cough up bloody sputum that had not been present before. You develop fever of 102 F (38.9 C) or greater. You develop worsening pain at or near the incision site. MAKE SURE YOU:  Understand these instructions. Will watch your condition. Will get help right away if you are not doing well or get worse. Document Released: 05/24/2006 Document Revised: 04/05/2011 Document Reviewed: 07/25/2006 [facility] Patient Information 2014 [place], [place].   ________________________________________________________________________

## 2024-01-25 NOTE — Progress Notes (Addendum)
 PCP - Janey Santos, MD  12-16-23 clearance media Cardiologist - Kriste Emeline HAS LOV 01-23-24 epic preop eval     PPM/ICD -  Device Orders -  Rep Notified -   Chest x-ray -  EKG - 11-30-23 epic Stress Test -  ECHO - 12-07-23 epic Cardiac Cath -  NM cardiac Pet 12-06-23 epic  Sleep Study - has a sleep study scheduled 02-01-24 CPAP - n/a  Fasting Blood Sugar - n/a Checks Blood Sugar _n/a____ times a day  Blood Thinner Instructions: Aspirin  Instructions:81 asa mg asa stop 5 days  ERAS Protcol - PRE-SURGERY Ensure    COVID vaccine -yes  Activity--Able to complete ADL's with no CP or SOB Anesthesia review: HTN, CKD, Elevated Coronary calcium  score, difficult airway noted in epic pt. Denies.  Patient denies shortness of breath, fever, cough and chest pain at PAT appointment   All instructions explained to the patient, with a verbal understanding of the material. Patient agrees to go over the instructions while at home for a better understanding. Patient also instructed to self quarantine after being tested for COVID-19. The opportunity to ask questions was provided.

## 2024-01-30 ENCOUNTER — Ambulatory Visit: Payer: Self-pay | Admitting: Student

## 2024-01-30 NOTE — H&P (View-Only) (Signed)
 TOTAL KNEE ADMISSION H&P  Patient is being admitted for left total knee arthroplasty.  Subjective:  Chief Complaint:left knee pain.  HPI: Steven Holloway, 79 y.o. male, has a history of pain and functional disability in the left knee due to arthritis and has failed non-surgical conservative treatments for greater than 12 weeks to includeNSAID's and/or analgesics, corticosteriod injections, viscosupplementation injections, flexibility and strengthening excercises, use of assistive devices, and activity modification.  Onset of symptoms was gradual, starting >10 years ago with rapidlly worsening course since that time. The patient noted no past surgery on the left knee(s).  Patient currently rates pain in the left knee(s) at 10 out of 10 with activity. Patient has night pain, worsening of pain with activity and weight bearing, pain that interferes with activities of daily living, pain with passive range of motion, crepitus, and joint swelling.  Patient has evidence of subchondral cysts, subchondral sclerosis, periarticular osteophytes, and joint space narrowing by imaging studies. There is no active infection.  Patient Active Problem List   Diagnosis Date Noted   Witnessed episode of apnea 11/21/2023   Snoring 11/21/2023   Other chronic pain 11/21/2023   Morbid obesity (HCC) 11/21/2023   Intermittent palpitations 11/21/2023   Eustachian tube dysfunction, bilateral 08/12/2022   Biceps femoris tendinitis 09/04/2021   Hx of difficult intubation 01/15/2021   Deviated septum 05/14/2016   Nasal turbinate hypertrophy 05/14/2016   Rhinitis, chronic 05/14/2016   S/P lumbar laminectomy 04/22/2016   Hx of adenomatous colonic polyps 05/27/2014   Past Medical History:  Diagnosis Date   Allergic rhinitis    Anxiety    on meds   Arthritis    generalized   Cataract    BILATERAL   Colon polyps    DDD (degenerative disc disease), lumbar    Difficult intubation    pt. denies   Fatigue    GERD  (gastroesophageal reflux disease)    on meds   Heart murmur    Echo 2025   Hematuria    History of kidney stones    History of recurrent UTIs    Hx of adenomatous colonic polyps 05/27/2014   Hyperlipidemia    on meds   Hypertension    on meds   IFG (impaired fasting glucose)    Joint disease    Macular degeneration of both eyes    Pneumonia    PSA (psoriatic arthritis) (HCC)    Seasonal allergies    Tubular adenoma     Past Surgical History:  Procedure Laterality Date   APPENDECTOMY  01/25/1978   COLONOSCOPY  2019   CG-MAC-mira(good)-SSP x 2, TA x 3, recall 2022   LUMBAR LAMINECTOMY/DECOMPRESSION MICRODISCECTOMY N/A 04/22/2016   Procedure: Laminectomy and Foraminotomy - Lumbar Two-Three,Lumbar Three-Four,Lumbar Four-Five with sublaminar decompression;  Surgeon: Alm GORMAN Molt, MD;  Location: [facility] OR;  Service: Neurosurgery;  Laterality: N/A;    Current Outpatient Medications  Medication Sig Dispense Refill Last Dose/Taking   aspirin  EC 81 MG tablet Take 81 mg by mouth at bedtime.       CRANBERRY PO Take 1,500 mg by mouth daily.      escitalopram  (LEXAPRO ) 5 MG tablet Take 5 mg by mouth daily.      fluticasone  (FLONASE ) 50 MCG/ACT nasal spray Place 1 spray into both nostrils at bedtime. 15.8 mL 5    metoprolol  succinate (TOPROL -XL) 25 MG 24 hr tablet TAKE 1 TABLET BY MOUTH ONCE  DAILY 15 tablet 0    potassium chloride  SA (KLOR-CON  M) 20  MEQ tablet Take 20 mEq by mouth 2 (two) times daily.      rosuvastatin  (CRESTOR ) 20 MG tablet TAKE 1 TABLET BY MOUTH DAILY 30 tablet 11    tamsulosin  (FLOMAX ) 0.4 MG CAPS capsule Take 0.4 mg by mouth daily.      telmisartan (MICARDIS) 40 MG tablet Take 40 mg by mouth daily.      No current facility-administered medications for this visit.   Allergies[1]  Social History   Tobacco Use   Smoking status: Former    Types: Cigarettes   Smokeless tobacco: Never   Tobacco comments:    Smoked 3 years in [employer]  Substance Use Topics   Alcohol   use: Not Currently    Alcohol /week: 0.0 - 1.0 standard drinks of alcohol     Comment: occasional    Family History  Problem Relation Age of Onset   Kidney disease Mother    Dementia Mother    CAD Mother    Hypertension Brother    Migraines Other    Depression Other    Colon cancer Neg Hx    Esophageal cancer Neg Hx    Rectal cancer Neg Hx    Stomach cancer Neg Hx    Colon polyps Neg Hx    Seizures Neg Hx    Stroke Neg Hx    Sleep apnea Neg Hx      Review of Systems  Musculoskeletal:  Positive for arthralgias, gait problem and joint swelling.  All other systems reviewed and are negative.   Objective:  Physical Exam Constitutional:      Appearance: Normal appearance.  HENT:     Head: Normocephalic and atraumatic.     Nose: Nose normal.     Mouth/Throat:     Mouth: Mucous membranes are moist.     Pharynx: Oropharynx is clear.  Eyes:     Conjunctiva/sclera: Conjunctivae normal.  Cardiovascular:     Rate and Rhythm: Normal rate and regular rhythm.     Pulses: Normal pulses.     Heart sounds: Normal heart sounds.  Pulmonary:     Effort: Pulmonary effort is normal.     Breath sounds: Normal breath sounds.  Abdominal:     General: Abdomen is flat. Bowel sounds are normal.  Genitourinary:    Comments: deferred Musculoskeletal:     Cervical back: Normal range of motion and neck supple.     Comments: Examination left knee reveals no skin lesions. Mild swelling. No erythema, or effusion. Tenderness to palpation medial joint line and peripatellar retinacular tissues. Range of motion 15-115 without any instability. Mild pain in the position of impingement.  Distally, there is no focal motor or sensory deficit. Has palpable pedal pulses.  Skin:    General: Skin is warm and dry.     Capillary Refill: Capillary refill takes less than 2 seconds.  Neurological:     General: No focal deficit present.     Mental Status: He is alert and oriented to person, place, and time.   Psychiatric:        Mood and Affect: Mood normal.        Behavior: Behavior normal.        Thought Content: Thought content normal.        Judgment: Judgment normal.     Vital signs in last 24 hours: @VSRANGES @  Labs:   Estimated body mass index is 34.03 kg/m as calculated from the following:   Height as of 02/01/24: 5' 6.34 (1.685 m).  Weight as of 02/01/24: 96.6 kg.   Imaging Review Plain radiographs demonstrate severe degenerative joint disease of the left knee(s). The overall alignment issignificant varus. The bone quality appears to be adequate for age and reported activity level.      Assessment/Plan:  End stage arthritis, left knee   The patient history, physical examination, clinical judgment of the provider and imaging studies are consistent with end stage degenerative joint disease of the left knee(s) and total knee arthroplasty is deemed medically necessary. The treatment options including medical management, injection therapy arthroscopy and arthroplasty were discussed at length. The risks and benefits of total knee arthroplasty were presented and reviewed. The risks due to aseptic loosening, infection, stiffness, patella tracking problems, thromboembolic complications and other imponderables were discussed. The patient acknowledged the explanation, agreed to proceed with the plan and consent was signed. Patient is being admitted for inpatient treatment for surgery, pain control, PT, OT, prophylactic antibiotics, VTE prophylaxis, progressive ambulation and ADL's and discharge planning. The patient is planning to be discharged home with OPPT after an overnight stay.   Therapy Plans: outpatient therapy. PT [facility] 02/13/24.  Disposition: Home with wife Nena Planned DVT Prophylaxis: aspirin  81mg  BID DME needed: walker. Ice machine provided in office today.  PCP: Cleared.  Cardiology: Cleared. May hold aspirin  5-7 days as needed.  TXA: IV Allergies:  -  Ciprofloxacin  - N/V. - Sulfa antibiotics - GI intolerance.  Anesthesia Concerns: None.  BMI: 33.7 Last HgbA1c: Not diabetic.  Other: - CAD, aspirin  81mg  baseline.  - Last cortisone injection 08/16/23 left knee.  - Oxycodone , zofran . - 02/01/24: Hgb 14.3, K+ 4.0, Cr. 1.30.  - no NSAIDs.    Patient's anticipated LOS is less than 2 midnights, meeting these requirements: - Younger than 37 - Lives within 1 hour of care - Has a competent adult at home to recover with post-op recover - NO history of  - Chronic pain requiring opiods  - Diabetes  - Coronary Artery Disease  - Heart failure  - Heart attack  - Stroke  - DVT/VTE  - Cardiac arrhythmia  - Respiratory Failure/COPD  - Renal failure  - Anemia  - Advanced Liver disease      [1]  Allergies Allergen Reactions   Sulfa Antibiotics Nausea And Vomiting   Sulfasalazine Nausea And Vomiting   Ciprofloxacin  Nausea And Vomiting   Other Other (See Comments)

## 2024-01-30 NOTE — H&P (Signed)
 TOTAL KNEE ADMISSION H&P  Patient is being admitted for left total knee arthroplasty.  Subjective:  Chief Complaint:left knee pain.  HPI: Steven Holloway, 79 y.o. male, has a history of pain and functional disability in the left knee due to arthritis and has failed non-surgical conservative treatments for greater than 12 weeks to includeNSAID's and/or analgesics, corticosteriod injections, viscosupplementation injections, flexibility and strengthening excercises, use of assistive devices, and activity modification.  Onset of symptoms was gradual, starting >10 years ago with rapidlly worsening course since that time. The patient noted no past surgery on the left knee(s).  Patient currently rates pain in the left knee(s) at 10 out of 10 with activity. Patient has night pain, worsening of pain with activity and weight bearing, pain that interferes with activities of daily living, pain with passive range of motion, crepitus, and joint swelling.  Patient has evidence of subchondral cysts, subchondral sclerosis, periarticular osteophytes, and joint space narrowing by imaging studies. There is no active infection.  Patient Active Problem List   Diagnosis Date Noted   Witnessed episode of apnea 11/21/2023   Snoring 11/21/2023   Other chronic pain 11/21/2023   Morbid obesity (HCC) 11/21/2023   Intermittent palpitations 11/21/2023   Eustachian tube dysfunction, bilateral 08/12/2022   Biceps femoris tendinitis 09/04/2021   Hx of difficult intubation 01/15/2021   Deviated septum 05/14/2016   Nasal turbinate hypertrophy 05/14/2016   Rhinitis, chronic 05/14/2016   S/P lumbar laminectomy 04/22/2016   Hx of adenomatous colonic polyps 05/27/2014   Past Medical History:  Diagnosis Date   Allergic rhinitis    Anxiety    on meds   Arthritis    generalized   Cataract    BILATERAL   Colon polyps    DDD (degenerative disc disease), lumbar    Difficult intubation    pt. denies   Fatigue    GERD  (gastroesophageal reflux disease)    on meds   Heart murmur    Echo 2025   Hematuria    History of kidney stones    History of recurrent UTIs    Hx of adenomatous colonic polyps 05/27/2014   Hyperlipidemia    on meds   Hypertension    on meds   IFG (impaired fasting glucose)    Joint disease    Macular degeneration of both eyes    Pneumonia    PSA (psoriatic arthritis) (HCC)    Seasonal allergies    Tubular adenoma     Past Surgical History:  Procedure Laterality Date   APPENDECTOMY  01/25/1978   COLONOSCOPY  2019   CG-MAC-mira(good)-SSP x 2, TA x 3, recall 2022   LUMBAR LAMINECTOMY/DECOMPRESSION MICRODISCECTOMY N/A 04/22/2016   Procedure: Laminectomy and Foraminotomy - Lumbar Two-Three,Lumbar Three-Four,Lumbar Four-Five with sublaminar decompression;  Surgeon: Alm GORMAN Molt, MD;  Location: [facility] OR;  Service: Neurosurgery;  Laterality: N/A;    Current Outpatient Medications  Medication Sig Dispense Refill Last Dose/Taking   aspirin  EC 81 MG tablet Take 81 mg by mouth at bedtime.       CRANBERRY PO Take 1,500 mg by mouth daily.      escitalopram  (LEXAPRO ) 5 MG tablet Take 5 mg by mouth daily.      fluticasone  (FLONASE ) 50 MCG/ACT nasal spray Place 1 spray into both nostrils at bedtime. 15.8 mL 5    metoprolol  succinate (TOPROL -XL) 25 MG 24 hr tablet TAKE 1 TABLET BY MOUTH ONCE  DAILY 15 tablet 0    potassium chloride  SA (KLOR-CON  M) 20  MEQ tablet Take 20 mEq by mouth 2 (two) times daily.      rosuvastatin  (CRESTOR ) 20 MG tablet TAKE 1 TABLET BY MOUTH DAILY 30 tablet 11    tamsulosin  (FLOMAX ) 0.4 MG CAPS capsule Take 0.4 mg by mouth daily.      telmisartan (MICARDIS) 40 MG tablet Take 40 mg by mouth daily.      No current facility-administered medications for this visit.   Allergies[1]  Social History   Tobacco Use   Smoking status: Former    Types: Cigarettes   Smokeless tobacco: Never   Tobacco comments:    Smoked 3 years in [employer]  Substance Use Topics   Alcohol   use: Not Currently    Alcohol /week: 0.0 - 1.0 standard drinks of alcohol     Comment: occasional    Family History  Problem Relation Age of Onset   Kidney disease Mother    Dementia Mother    CAD Mother    Hypertension Brother    Migraines Other    Depression Other    Colon cancer Neg Hx    Esophageal cancer Neg Hx    Rectal cancer Neg Hx    Stomach cancer Neg Hx    Colon polyps Neg Hx    Seizures Neg Hx    Stroke Neg Hx    Sleep apnea Neg Hx      Review of Systems  Musculoskeletal:  Positive for arthralgias, gait problem and joint swelling.  All other systems reviewed and are negative.   Objective:  Physical Exam Constitutional:      Appearance: Normal appearance.  HENT:     Head: Normocephalic and atraumatic.     Nose: Nose normal.     Mouth/Throat:     Mouth: Mucous membranes are moist.     Pharynx: Oropharynx is clear.  Eyes:     Conjunctiva/sclera: Conjunctivae normal.  Cardiovascular:     Rate and Rhythm: Normal rate and regular rhythm.     Pulses: Normal pulses.     Heart sounds: Normal heart sounds.  Pulmonary:     Effort: Pulmonary effort is normal.     Breath sounds: Normal breath sounds.  Abdominal:     General: Abdomen is flat. Bowel sounds are normal.  Genitourinary:    Comments: deferred Musculoskeletal:     Cervical back: Normal range of motion and neck supple.     Comments: Examination left knee reveals no skin lesions. Mild swelling. No erythema, or effusion. Tenderness to palpation medial joint line and peripatellar retinacular tissues. Range of motion 15-115 without any instability. Mild pain in the position of impingement.  Distally, there is no focal motor or sensory deficit. Has palpable pedal pulses.  Skin:    General: Skin is warm and dry.     Capillary Refill: Capillary refill takes less than 2 seconds.  Neurological:     General: No focal deficit present.     Mental Status: He is alert and oriented to person, place, and time.   Psychiatric:        Mood and Affect: Mood normal.        Behavior: Behavior normal.        Thought Content: Thought content normal.        Judgment: Judgment normal.     Vital signs in last 24 hours: @VSRANGES @  Labs:   Estimated body mass index is 34.03 kg/m as calculated from the following:   Height as of 02/01/24: 5' 6.34 (1.685 m).  Weight as of 02/01/24: 96.6 kg.   Imaging Review Plain radiographs demonstrate severe degenerative joint disease of the left knee(s). The overall alignment issignificant varus. The bone quality appears to be adequate for age and reported activity level.      Assessment/Plan:  End stage arthritis, left knee   The patient history, physical examination, clinical judgment of the provider and imaging studies are consistent with end stage degenerative joint disease of the left knee(s) and total knee arthroplasty is deemed medically necessary. The treatment options including medical management, injection therapy arthroscopy and arthroplasty were discussed at length. The risks and benefits of total knee arthroplasty were presented and reviewed. The risks due to aseptic loosening, infection, stiffness, patella tracking problems, thromboembolic complications and other imponderables were discussed. The patient acknowledged the explanation, agreed to proceed with the plan and consent was signed. Patient is being admitted for inpatient treatment for surgery, pain control, PT, OT, prophylactic antibiotics, VTE prophylaxis, progressive ambulation and ADL's and discharge planning. The patient is planning to be discharged home with OPPT after an overnight stay.   Therapy Plans: outpatient therapy. PT [facility] 02/13/24.  Disposition: Home with wife Nena Planned DVT Prophylaxis: aspirin  81mg  BID DME needed: walker. Ice machine provided in office today.  PCP: Cleared.  Cardiology: Cleared. May hold aspirin  5-7 days as needed.  TXA: IV Allergies:  -  Ciprofloxacin  - N/V. - Sulfa antibiotics - GI intolerance.  Anesthesia Concerns: None.  BMI: 33.7 Last HgbA1c: Not diabetic.  Other: - CAD, aspirin  81mg  baseline.  - Last cortisone injection 08/16/23 left knee.  - Oxycodone , zofran . - 02/01/24: Hgb 14.3, K+ 4.0, Cr. 1.30.  - no NSAIDs.    Patient's anticipated LOS is less than 2 midnights, meeting these requirements: - Younger than 37 - Lives within 1 hour of care - Has a competent adult at home to recover with post-op recover - NO history of  - Chronic pain requiring opiods  - Diabetes  - Coronary Artery Disease  - Heart failure  - Heart attack  - Stroke  - DVT/VTE  - Cardiac arrhythmia  - Respiratory Failure/COPD  - Renal failure  - Anemia  - Advanced Liver disease      [1]  Allergies Allergen Reactions   Sulfa Antibiotics Nausea And Vomiting   Sulfasalazine Nausea And Vomiting   Ciprofloxacin  Nausea And Vomiting   Other Other (See Comments)

## 2024-01-30 NOTE — Progress Notes (Signed)
 Second request for pre op orders IB-S. Wills.

## 2024-02-01 ENCOUNTER — Ambulatory Visit: Admitting: Neurology

## 2024-02-01 ENCOUNTER — Encounter (HOSPITAL_COMMUNITY)
Admission: RE | Admit: 2024-02-01 | Discharge: 2024-02-01 | Disposition: A | Discharge status: Home / Self Care | Source: Ambulatory Visit | Attending: Orthopedic Surgery | Admitting: Orthopedic Surgery

## 2024-02-01 ENCOUNTER — Encounter (HOSPITAL_COMMUNITY): Payer: Self-pay

## 2024-02-01 ENCOUNTER — Other Ambulatory Visit: Payer: Self-pay

## 2024-02-01 VITALS — BP 138/72 | HR 78 | Temp 98.0°F | Resp 16 | Ht 66.34 in | Wt 213.0 lb

## 2024-02-01 DIAGNOSIS — Z01812 Encounter for preprocedural laboratory examination: Secondary | ICD-10-CM | POA: Diagnosis present

## 2024-02-01 DIAGNOSIS — I130 Hypertensive heart and chronic kidney disease with heart failure and stage 1 through stage 4 chronic kidney disease, or unspecified chronic kidney disease: Secondary | ICD-10-CM | POA: Diagnosis not present

## 2024-02-01 DIAGNOSIS — R0681 Apnea, not elsewhere classified: Secondary | ICD-10-CM

## 2024-02-01 DIAGNOSIS — Z87891 Personal history of nicotine dependence: Secondary | ICD-10-CM | POA: Insufficient documentation

## 2024-02-01 DIAGNOSIS — Z01818 Encounter for other preprocedural examination: Secondary | ICD-10-CM

## 2024-02-01 DIAGNOSIS — J310 Chronic rhinitis: Secondary | ICD-10-CM

## 2024-02-01 DIAGNOSIS — N183 Chronic kidney disease, stage 3 unspecified: Secondary | ICD-10-CM | POA: Diagnosis not present

## 2024-02-01 DIAGNOSIS — R0683 Snoring: Secondary | ICD-10-CM | POA: Diagnosis not present

## 2024-02-01 DIAGNOSIS — I5030 Unspecified diastolic (congestive) heart failure: Secondary | ICD-10-CM | POA: Diagnosis not present

## 2024-02-01 DIAGNOSIS — J342 Deviated nasal septum: Secondary | ICD-10-CM

## 2024-02-01 DIAGNOSIS — M1712 Unilateral primary osteoarthritis, left knee: Secondary | ICD-10-CM | POA: Diagnosis not present

## 2024-02-01 DIAGNOSIS — Z9189 Other specified personal risk factors, not elsewhere classified: Secondary | ICD-10-CM

## 2024-02-01 DIAGNOSIS — G8929 Other chronic pain: Secondary | ICD-10-CM

## 2024-02-01 DIAGNOSIS — I2510 Atherosclerotic heart disease of native coronary artery without angina pectoris: Secondary | ICD-10-CM | POA: Diagnosis not present

## 2024-02-01 DIAGNOSIS — R002 Palpitations: Secondary | ICD-10-CM

## 2024-02-01 DIAGNOSIS — I10 Essential (primary) hypertension: Secondary | ICD-10-CM

## 2024-02-01 HISTORY — DX: Failed or difficult intubation, initial encounter: T88.4XXA

## 2024-02-01 HISTORY — DX: Pneumonia, unspecified organism: J18.9

## 2024-02-01 HISTORY — DX: Cardiac murmur, unspecified: R01.1

## 2024-02-01 HISTORY — DX: Personal history of urinary calculi: Z87.442

## 2024-02-01 LAB — BASIC METABOLIC PANEL WITH GFR
Anion gap: 7 (ref 5–15)
BUN: 13 mg/dL (ref 8–23)
CO2: 27 mmol/L (ref 22–32)
Calcium: 9.2 mg/dL (ref 8.9–10.3)
Chloride: 109 mmol/L (ref 98–111)
Creatinine, Ser: 1.3 mg/dL — ABNORMAL HIGH (ref 0.61–1.24)
GFR, Estimated: 56 mL/min — ABNORMAL LOW
Glucose, Bld: 127 mg/dL — ABNORMAL HIGH (ref 70–99)
Potassium: 4 mmol/L (ref 3.5–5.1)
Sodium: 143 mmol/L (ref 135–145)

## 2024-02-01 LAB — CBC
HCT: 43.6 % (ref 39.0–52.0)
Hemoglobin: 14.3 g/dL (ref 13.0–17.0)
MCH: 29.5 pg (ref 26.0–34.0)
MCHC: 32.8 g/dL (ref 30.0–36.0)
MCV: 89.9 fL (ref 80.0–100.0)
Platelets: 145 K/uL — ABNORMAL LOW (ref 150–400)
RBC: 4.85 MIL/uL (ref 4.22–5.81)
RDW: 12.1 % (ref 11.5–15.5)
WBC: 4.6 K/uL (ref 4.0–10.5)
nRBC: 0 % (ref 0.0–0.2)

## 2024-02-01 LAB — SURGICAL PCR SCREEN
MRSA, PCR: NEGATIVE
Staphylococcus aureus: NEGATIVE

## 2024-02-02 NOTE — Anesthesia Preprocedure Evaluation (Signed)
 "                                  Anesthesia Evaluation  Patient identified by MRN, date of birth, ID band Patient awake    Reviewed: Allergy & Precautions, NPO status , Patient's Chart, lab work & pertinent test results, reviewed documented beta blocker date and time   History of Anesthesia Complications (+) DIFFICULT AIRWAY and history of anesthetic complications  Airway Mallampati: III  TM Distance: >3 FB Neck ROM: Full    Dental  (+) Dental Advisory Given, Missing,    Pulmonary former smoker   Pulmonary exam normal breath sounds clear to auscultation       Cardiovascular hypertension, Pt. on home beta blockers and Pt. on medications + CAD  Normal cardiovascular exam Rhythm:Regular Rate:Normal  TTE 2025  1. Left ventricular ejection fraction, by estimation, is 55 to 60%. The  left ventricle has normal function. The left ventricle has no regional  wall motion abnormalities. Left ventricular diastolic parameters are  consistent with Grade II diastolic  dysfunction (pseudonormalization).   2. Right ventricular systolic function is normal. The right ventricular  size is normal.   3. Left atrial size was moderately dilated.   4. The mitral valve is normal in structure. No evidence of mitral valve  regurgitation. No evidence of mitral stenosis.   5. The aortic valve is calcified. There is mild calcification of the  aortic valve. Aortic valve regurgitation is mild. No aortic stenosis is  present.   6. The inferior vena cava is normal in size with greater than 50%  respiratory variability, suggesting right atrial pressure of 3 mmHg.   PET study shows: Medium defect with mild reduction in uptake present in the apical to mid inferolateral and lateral location(s) that is reversible. There is normal wall motion in the defect area. Consistent with ischemia.  This study is low risk He has known moderate CAD by coronary CTA in 2021.  These findings suggest stability  over time compared to his prior CT results and there are no high risk coronary findings.       Neuro/Psych  PSYCHIATRIC DISORDERS Anxiety     negative neurological ROS     GI/Hepatic Neg liver ROS,GERD  ,,  Endo/Other  negative endocrine ROS    Renal/GU Renal InsufficiencyRenal disease  negative genitourinary   Musculoskeletal  (+) Arthritis ,  S/p lumbar lami 2018   Abdominal   Peds  Hematology negative hematology ROS (+)   Anesthesia Other Findings Last airway note 2018: Ventilation: Mask ventilation without difficulty Laryngoscope Size: Mac and 4 Grade View: Grade III Tube type: Oral Tube size: 7.5 mm Number of attempts: 1  79 yo male with PMH of former smoking, HTN, CAD (by imaging), HFpEF, GERD, CKD3, anxiety, arthritis.  Reproductive/Obstetrics                              Anesthesia Physical Anesthesia Plan  ASA: 3  Anesthesia Plan: Spinal and Regional   Post-op Pain Management: Regional block* and Tylenol  PO (pre-op)*   Induction:   PONV Risk Score and Plan: 1 and Treatment may vary due to age or medical condition, TIVA, Ondansetron  and Dexamethasone   Airway Management Planned: Natural Airway  Additional Equipment:   Intra-op Plan:   Post-operative Plan:   Informed Consent: I have reviewed the patients History and Physical,  chart, labs and discussed the procedure including the risks, benefits and alternatives for the proposed anesthesia with the patient or authorized representative who has indicated his/her understanding and acceptance.     Dental advisory given  Plan Discussed with: CRNA  Anesthesia Plan Comments: (See PAT note from 1/7)         Anesthesia Quick Evaluation  "

## 2024-02-02 NOTE — Progress Notes (Addendum)
 " Case: [id number] Date/Time: 02/09/24 1047   Procedure: ARTHROPLASTY, KNEE, TOTAL, USING IMAGELESS COMPUTER-ASSISTED NAVIGATION (Left: Knee)   Anesthesia type: Spinal   Diagnosis: Primary osteoarthritis of left knee [M17.12]   Pre-op diagnosis: Left knee osteoarthritis   [facility] ROOM 08 [facility]   Surgeons: Fidel Rogue, MD        DISCUSSION: Steven Holloway is a 79 yo male with PMH of former smoking, HTN, CAD (by imaging), HFpEF, GERD, CKD3, anxiety, arthritis.  Difficult airway noted in chart. Only one prior anesthesia note to review from 2018 with comments: Difficulty Due To: Difficult Airway- due to anterior larynx. Mac and 4 used with Grade III view.  Patient follows with Cardiology for hx of CAD, hypertension with orthostasis. Seen in 11/2023 for pre op clearance and patient reported worsening SOB with exertion so stress test and echo were ordered. Stress test showed evidence of ischemia but was also low risk. Echo showed normal LVEF with grade II DD. Per Dr. Kriste regarding results:   PET study shows: Medium defect with mild reduction in uptake present in the apical to mid inferolateral and lateral location(s) that is reversible. There is normal wall motion in the defect area. Consistent with ischemia.  This study is low risk Echocardiogram shows normal ejection fraction with grade 2 diastolic dysfunction and evidence of increased left atrial pressure.  Mild aortic regurgitation. He has known moderate CAD by coronary CTA in 2021.  These findings suggest stability over time compared to his prior CT results and there are no high risk coronary findings.   Given these findings and after discussion with the interventional cardiologist, there is no further preop cardiac workup needed.  He is considered low to intermediate perioperative cardiovascular risk for the anticipated intermediate risk orthopedic procedure. *If he still having shortness of breath we can trial Lasix 20 mg for 2  days however he will need to be mindful to get up slowly as he does have a history of orthostasis  Seen again on 01/23/24 for pre op clearance by Dr. Kriste. Patient reported that DOE had resolved. He reports he is able to walk for 15 minutes but then will have knee pain. No CP/SOB. He was advised to  continue medicines and f/u in 6 months. Cleared for surgery:  Patient is a low to intermediate perioperative cardiac risk of MACE for the anticipated intermediate risk procedure.  No further cardiac workup needed.  Patient may hold his aspirin  preoperatively if needed but should restart as soon as possible.  Discussed with Dr. Keneth regarding abnormal stress test. Ok to proceed.  VS: BP 138/72   Pulse 78   Temp 36.7 C (Oral)   Resp 16   Ht 5' 6.34 (1.685 m)   Wt 96.6 kg   SpO2 (!) 9%   BMI 34.03 kg/m   PROVIDERS: Avva, Ravisankar, MD   LABS: Labs reviewed: Acceptable for surgery. (all labs ordered are listed, but only abnormal results are displayed)  Labs Reviewed  BASIC METABOLIC PANEL WITH GFR - Abnormal; Notable for the following components:      Result Value   Glucose, Bld 127 (*)    Creatinine, Ser 1.30 (*)    GFR, Estimated 56 (*)    All other components within normal limits  CBC - Abnormal; Notable for the following components:   Platelets 145 (*)    All other components within normal limits  SURGICAL PCR SCREEN     CT Chest overread 12/06/23:  IMPRESSION: 1. Airway thickening is present, suggesting bronchitis or reactive airways disease. 2. Thoracic spondylosis. 3.  Aortic Atherosclerosis (ICD10-I70.0).    EKG 11/30/23:  Sinus bradycardia, rate 58 Inferior infarct, age undetermined  Echo 12/07/23:  IMPRESSIONS    1. Left ventricular ejection fraction, by estimation, is 55 to 60%. The left ventricle has normal function. The left ventricle has no regional wall motion abnormalities. Left ventricular diastolic parameters are consistent with Grade II  diastolic dysfunction (pseudonormalization).  2. Right ventricular systolic function is normal. The right ventricular size is normal.  3. Left atrial size was moderately dilated.  4. The mitral valve is normal in structure. No evidence of mitral valve regurgitation. No evidence of mitral stenosis.  5. The aortic valve is calcified. There is mild calcification of the aortic valve. Aortic valve regurgitation is mild. No aortic stenosis is present.  6. The inferior vena cava is normal in size with greater than 50% respiratory variability, suggesting right atrial pressure of 3 mmHg.  NM PET CT Cardiac stress test 12/06/23:  Narrative & Impression     LV perfusion is abnormal. There is evidence of ischemia. Defect 1: There is a medium defect with mild reduction in uptake present in the apical to mid inferolateral and lateral location(s) that is reversible. There is normal wall motion in the defect area. Consistent with ischemia.   Rest EF: 58%. Stress left ventricular function is normal. End diastolic cavity size is normal. End systolic cavity size is normal.   Myocardial blood flow was computed to be 0.63ml/g/min at rest and 1.35ml/g/min at stress. Global myocardial blood flow reserve was 1.85 and was abnormal.   Coronary calcium  was present on the attenuation correction CT images. Moderate coronary calcifications were present. Coronary calcifications were present in the left anterior descending artery, left circumflex artery and right coronary artery distribution(s).   Findings are consistent with ischemia. The study is low risk.   Reserve in area of ischemia 1.66  Past Medical History:  Diagnosis Date   Allergic rhinitis    Anxiety    on meds   Arthritis    generalized   Cataract    BILATERAL   Colon polyps    DDD (degenerative disc disease), lumbar    Difficult intubation    pt. denies   Fatigue    GERD (gastroesophageal reflux disease)    on meds   Heart murmur    Echo 2025    Hematuria    History of kidney stones    History of recurrent UTIs    Hx of adenomatous colonic polyps 05/27/2014   Hyperlipidemia    on meds   Hypertension    on meds   IFG (impaired fasting glucose)    Joint disease    Macular degeneration of both eyes    Pneumonia    PSA (psoriatic arthritis) (HCC)    Seasonal allergies    Tubular adenoma     Past Surgical History:  Procedure Laterality Date   APPENDECTOMY  01/25/1978   COLONOSCOPY  2019   CG-MAC-mira(good)-SSP x 2, TA x 3, recall 2022   LUMBAR LAMINECTOMY/DECOMPRESSION MICRODISCECTOMY N/A 04/22/2016   Procedure: Laminectomy and Foraminotomy - Lumbar Two-Three,Lumbar Three-Four,Lumbar Four-Five with sublaminar decompression;  Surgeon: Alm GORMAN Molt, MD;  Location: [facility] OR;  Service: Neurosurgery;  Laterality: N/A;    MEDICATIONS:  aspirin  EC 81 MG tablet   CRANBERRY PO   escitalopram  (LEXAPRO ) 5 MG tablet   fluticasone  (FLONASE ) 50 MCG/ACT nasal spray   metoprolol   succinate (TOPROL -XL) 25 MG 24 hr tablet   potassium chloride  SA (KLOR-CON  M) 20 MEQ tablet   rosuvastatin  (CRESTOR ) 20 MG tablet   tamsulosin  (FLOMAX ) 0.4 MG CAPS capsule   telmisartan (MICARDIS) 40 MG tablet   No current facility-administered medications for this encounter.   Burnard CHRISTELLA Odis DEVONNA [facility] Short Stay/Anesthesiology [facility] Phone [phone] 02/02/2024 1:55 PM       "

## 2024-02-09 ENCOUNTER — Ambulatory Visit (HOSPITAL_COMMUNITY)
Admission: RE | Admit: 2024-02-09 | Discharge: 2024-02-10 | Disposition: A | Discharge status: Home / Self Care | Attending: Orthopedic Surgery | Admitting: Orthopedic Surgery

## 2024-02-09 ENCOUNTER — Other Ambulatory Visit: Payer: Self-pay

## 2024-02-09 ENCOUNTER — Ambulatory Visit (HOSPITAL_COMMUNITY): Payer: Self-pay | Admitting: Medical

## 2024-02-09 ENCOUNTER — Ambulatory Visit (HOSPITAL_COMMUNITY)

## 2024-02-09 ENCOUNTER — Encounter (HOSPITAL_COMMUNITY): Payer: Self-pay | Admitting: Orthopedic Surgery

## 2024-02-09 ENCOUNTER — Encounter (HOSPITAL_COMMUNITY): Admission: RE | Disposition: A | Payer: Self-pay | Source: Home / Self Care | Attending: Orthopedic Surgery

## 2024-02-09 ENCOUNTER — Ambulatory Visit (HOSPITAL_COMMUNITY): Admitting: Anesthesiology

## 2024-02-09 DIAGNOSIS — K219 Gastro-esophageal reflux disease without esophagitis: Secondary | ICD-10-CM | POA: Insufficient documentation

## 2024-02-09 DIAGNOSIS — I10 Essential (primary) hypertension: Secondary | ICD-10-CM

## 2024-02-09 DIAGNOSIS — I2510 Atherosclerotic heart disease of native coronary artery without angina pectoris: Secondary | ICD-10-CM | POA: Insufficient documentation

## 2024-02-09 DIAGNOSIS — F419 Anxiety disorder, unspecified: Secondary | ICD-10-CM

## 2024-02-09 DIAGNOSIS — M1712 Unilateral primary osteoarthritis, left knee: Secondary | ICD-10-CM | POA: Diagnosis not present

## 2024-02-09 DIAGNOSIS — Z96652 Presence of left artificial knee joint: Secondary | ICD-10-CM

## 2024-02-09 DIAGNOSIS — Z87891 Personal history of nicotine dependence: Secondary | ICD-10-CM | POA: Diagnosis not present

## 2024-02-09 HISTORY — PX: KNEE ARTHROPLASTY: SHX992

## 2024-02-09 MED ORDER — FENTANYL CITRATE (PF) 50 MCG/ML IJ SOSY
50.0000 ug | PREFILLED_SYRINGE | INTRAMUSCULAR | Status: DC
  Administered 2024-02-09: 100 ug via INTRAVENOUS
  Filled 2024-02-09: qty 2

## 2024-02-09 MED ORDER — MENTHOL 3 MG MT LOZG: 1.0000 | LOZENGE | OROMUCOSAL | Status: DC | PRN

## 2024-02-09 MED ORDER — METOCLOPRAMIDE HCL 5 MG/ML IJ SOLN: 5.0000 mg | Freq: Three times a day (TID) | INTRAMUSCULAR | Status: DC | PRN

## 2024-02-09 MED ORDER — METOCLOPRAMIDE HCL 5 MG PO TABS: 5.0000 mg | ORAL_TABLET | Freq: Three times a day (TID) | ORAL | Status: DC | PRN

## 2024-02-09 MED ORDER — ONDANSETRON HCL 4 MG/2ML IJ SOLN
INTRAMUSCULAR | Status: DC | PRN
  Administered 2024-02-09: 4 mg via INTRAVENOUS

## 2024-02-09 MED ORDER — CHLORHEXIDINE GLUCONATE 0.12 % MT SOLN
15.0000 mL | Freq: Once | OROMUCOSAL | Status: AC
  Administered 2024-02-09: 15 mL via OROMUCOSAL

## 2024-02-09 MED ORDER — POTASSIUM CHLORIDE CRYS ER 20 MEQ PO TBCR
20.0000 meq | EXTENDED_RELEASE_TABLET | Freq: Two times a day (BID) | ORAL | Status: DC
  Administered 2024-02-09 – 2024-02-10 (×2): 20 meq via ORAL
  Filled 2024-02-09 (×2): qty 1

## 2024-02-09 MED ORDER — ONDANSETRON HCL 4 MG PO TABS: 4.0000 mg | ORAL_TABLET | Freq: Four times a day (QID) | ORAL | Status: DC | PRN

## 2024-02-09 MED ORDER — CEFAZOLIN SODIUM-DEXTROSE 2-4 GM/100ML-% IV SOLN
2.0000 g | Freq: Four times a day (QID) | INTRAVENOUS | Status: AC
  Administered 2024-02-09 (×2): 2 g via INTRAVENOUS
  Filled 2024-02-09 (×2): qty 100

## 2024-02-09 MED ORDER — ACETAMINOPHEN 500 MG PO TABS
1000.0000 mg | ORAL_TABLET | Freq: Once | ORAL | Status: AC
  Administered 2024-02-09: 1000 mg via ORAL
  Filled 2024-02-09: qty 2

## 2024-02-09 MED ORDER — DIPHENHYDRAMINE HCL 12.5 MG/5ML PO ELIX: 12.5000 mg | ORAL_SOLUTION | ORAL | Status: DC | PRN

## 2024-02-09 MED ORDER — ROSUVASTATIN CALCIUM 20 MG PO TABS
20.0000 mg | ORAL_TABLET | Freq: Every day | ORAL | Status: DC
  Administered 2024-02-09: 20 mg via ORAL
  Filled 2024-02-09 (×2): qty 1

## 2024-02-09 MED ORDER — ISOPROPYL ALCOHOL 70 % SOLN
Status: DC | PRN
  Administered 2024-02-09: 1 via TOPICAL

## 2024-02-09 MED ORDER — HYDROMORPHONE HCL 1 MG/ML IJ SOLN: 0.5000 mg | INTRAMUSCULAR | Status: DC | PRN

## 2024-02-09 MED ORDER — ASPIRIN 81 MG PO CHEW
81.0000 mg | CHEWABLE_TABLET | Freq: Two times a day (BID) | ORAL | Status: DC
  Administered 2024-02-09 – 2024-02-10 (×2): 81 mg via ORAL
  Filled 2024-02-09 (×2): qty 1

## 2024-02-09 MED ORDER — METHOCARBAMOL 1000 MG/10ML IJ SOLN: 500.0000 mg | Freq: Four times a day (QID) | INTRAMUSCULAR | Status: DC | PRN

## 2024-02-09 MED ORDER — POLYETHYLENE GLYCOL 3350 17 G PO PACK: 17.0000 g | PACK | Freq: Every day | ORAL | Status: DC | PRN

## 2024-02-09 MED ORDER — BUPIVACAINE-EPINEPHRINE (PF) 0.25% -1:200000 IJ SOLN
INTRAMUSCULAR | Status: AC
  Filled 2024-02-09: qty 30

## 2024-02-09 MED ORDER — METOPROLOL SUCCINATE ER 25 MG PO TB24
25.0000 mg | ORAL_TABLET | Freq: Every day | ORAL | Status: DC
  Administered 2024-02-09: 25 mg via ORAL
  Filled 2024-02-09: qty 1

## 2024-02-09 MED ORDER — GLYCOPYRROLATE 0.2 MG/ML IJ SOLN
INTRAMUSCULAR | Status: DC | PRN
  Administered 2024-02-09 (×2): .1 mg via INTRAVENOUS

## 2024-02-09 MED ORDER — PROPOFOL 1000 MG/100ML IV EMUL
INTRAVENOUS | Status: AC
  Filled 2024-02-09: qty 100

## 2024-02-09 MED ORDER — OXYCODONE HCL 5 MG/5ML PO SOLN: 5.0000 mg | Freq: Once | ORAL | Status: DC | PRN

## 2024-02-09 MED ORDER — FENTANYL CITRATE (PF) 50 MCG/ML IJ SOSY: 25.0000 ug | PREFILLED_SYRINGE | INTRAMUSCULAR | Status: DC | PRN

## 2024-02-09 MED ORDER — SENNA 8.6 MG PO TABS
1.0000 | ORAL_TABLET | Freq: Two times a day (BID) | ORAL | Status: DC
  Administered 2024-02-09 – 2024-02-10 (×2): 8.6 mg via ORAL
  Filled 2024-02-09 (×2): qty 1

## 2024-02-09 MED ORDER — SODIUM CHLORIDE 0.9 % IR SOLN
Status: DC | PRN
  Administered 2024-02-09: 3000 mL

## 2024-02-09 MED ORDER — PROPOFOL 10 MG/ML IV BOLUS
INTRAVENOUS | Status: AC
  Filled 2024-02-09: qty 20

## 2024-02-09 MED ORDER — METHOCARBAMOL 500 MG PO TABS
500.0000 mg | ORAL_TABLET | Freq: Four times a day (QID) | ORAL | Status: DC | PRN
  Administered 2024-02-09: 500 mg via ORAL
  Filled 2024-02-09: qty 1

## 2024-02-09 MED ORDER — ACETAMINOPHEN 500 MG PO TABS
1000.0000 mg | ORAL_TABLET | Freq: Four times a day (QID) | ORAL | Status: DC
  Administered 2024-02-09 – 2024-02-10 (×3): 1000 mg via ORAL
  Filled 2024-02-09 (×3): qty 2

## 2024-02-09 MED ORDER — TAMSULOSIN HCL 0.4 MG PO CAPS
0.4000 mg | ORAL_CAPSULE | Freq: Every day | ORAL | Status: DC
  Administered 2024-02-09 – 2024-02-10 (×2): 0.4 mg via ORAL
  Filled 2024-02-09 (×2): qty 1

## 2024-02-09 MED ORDER — BUPIVACAINE IN DEXTROSE 0.75-8.25 % IT SOLN
INTRATHECAL | Status: DC | PRN
  Administered 2024-02-09: 2 mL via INTRATHECAL

## 2024-02-09 MED ORDER — AMISULPRIDE (ANTIEMETIC) 5 MG/2ML IV SOLN: 10.0000 mg | Freq: Once | INTRAVENOUS | Status: DC | PRN

## 2024-02-09 MED ORDER — POVIDONE-IODINE 10 % EX SWAB: 2.0000 | Freq: Once | CUTANEOUS | Status: DC

## 2024-02-09 MED ORDER — OXYCODONE HCL 5 MG PO TABS: 10.0000 mg | ORAL_TABLET | ORAL | Status: DC | PRN

## 2024-02-09 MED ORDER — DOCUSATE SODIUM 100 MG PO CAPS
100.0000 mg | ORAL_CAPSULE | Freq: Two times a day (BID) | ORAL | Status: DC
  Administered 2024-02-09 – 2024-02-10 (×2): 100 mg via ORAL
  Filled 2024-02-09 (×2): qty 1

## 2024-02-09 MED ORDER — SODIUM CHLORIDE (PF) 0.9 % IJ SOLN
INTRAMUSCULAR | Status: AC
  Filled 2024-02-09: qty 30

## 2024-02-09 MED ORDER — ESCITALOPRAM OXALATE 10 MG PO TABS
5.0000 mg | ORAL_TABLET | Freq: Every day | ORAL | Status: DC
  Administered 2024-02-09: 5 mg via ORAL
  Filled 2024-02-09 (×2): qty 1

## 2024-02-09 MED ORDER — CEFAZOLIN SODIUM-DEXTROSE 2-4 GM/100ML-% IV SOLN
2.0000 g | INTRAVENOUS | Status: AC
  Administered 2024-02-09: 2 g via INTRAVENOUS
  Filled 2024-02-09: qty 100

## 2024-02-09 MED ORDER — KETOROLAC TROMETHAMINE 30 MG/ML IJ SOLN
INTRAMUSCULAR | Status: AC
  Filled 2024-02-09: qty 1

## 2024-02-09 MED ORDER — BISACODYL 10 MG RE SUPP: 10.0000 mg | Freq: Every day | RECTAL | Status: DC | PRN

## 2024-02-09 MED ORDER — EPHEDRINE SULFATE-NACL 50-0.9 MG/10ML-% IV SOSY
PREFILLED_SYRINGE | INTRAVENOUS | Status: DC | PRN
  Administered 2024-02-09 (×3): 10 mg via INTRAVENOUS

## 2024-02-09 MED ORDER — EPHEDRINE 5 MG/ML INJ
INTRAVENOUS | Status: AC
  Filled 2024-02-09: qty 5

## 2024-02-09 MED ORDER — 0.9 % SODIUM CHLORIDE (POUR BTL) OPTIME
TOPICAL | Status: DC | PRN
  Administered 2024-02-09: 1000 mL

## 2024-02-09 MED ORDER — DEXAMETHASONE SOD PHOSPHATE PF 10 MG/ML IJ SOLN: INTRAMUSCULAR | Status: DC | PRN

## 2024-02-09 MED ORDER — LACTATED RINGERS IV SOLN: INTRAVENOUS | Status: DC

## 2024-02-09 MED ORDER — ACETAMINOPHEN 500 MG PO TABS: 1000.0000 mg | ORAL_TABLET | Freq: Once | ORAL | Status: DC

## 2024-02-09 MED ORDER — DEXAMETHASONE SOD PHOSPHATE PF 10 MG/ML IJ SOLN
INTRAMUSCULAR | Status: DC | PRN
  Administered 2024-02-09: 10 mg via PERINEURAL

## 2024-02-09 MED ORDER — PROPOFOL 500 MG/50ML IV EMUL
INTRAVENOUS | Status: DC | PRN
  Administered 2024-02-09: 85 ug/kg/min via INTRAVENOUS
  Administered 2024-02-09: 50 mg via INTRAVENOUS

## 2024-02-09 MED ORDER — PANTOPRAZOLE SODIUM 40 MG PO TBEC
40.0000 mg | DELAYED_RELEASE_TABLET | Freq: Every day | ORAL | Status: DC
  Administered 2024-02-09 – 2024-02-10 (×2): 40 mg via ORAL
  Filled 2024-02-09 (×2): qty 1

## 2024-02-09 MED ORDER — ONDANSETRON HCL 4 MG/2ML IJ SOLN: 4.0000 mg | Freq: Four times a day (QID) | INTRAMUSCULAR | Status: DC | PRN

## 2024-02-09 MED ORDER — ONDANSETRON HCL 4 MG/2ML IJ SOLN
INTRAMUSCULAR | Status: AC
  Filled 2024-02-09: qty 2

## 2024-02-09 MED ORDER — FLUTICASONE PROPIONATE 50 MCG/ACT NA SUSP
1.0000 | Freq: Every day | NASAL | Status: DC
  Administered 2024-02-09: 1 via NASAL
  Filled 2024-02-09: qty 16

## 2024-02-09 MED ORDER — SODIUM CHLORIDE 0.9 % IV SOLN: INTRAVENOUS | Status: DC

## 2024-02-09 MED ORDER — DEXAMETHASONE SOD PHOSPHATE PF 10 MG/ML IJ SOLN
INTRAMUSCULAR | Status: AC
  Filled 2024-02-09: qty 1

## 2024-02-09 MED ORDER — OXYCODONE HCL 5 MG PO TABS: 5.0000 mg | ORAL_TABLET | ORAL | Status: DC | PRN

## 2024-02-09 MED ORDER — OXYCODONE HCL 5 MG PO TABS: 5.0000 mg | ORAL_TABLET | Freq: Once | ORAL | Status: DC | PRN

## 2024-02-09 MED ORDER — PHENOL 1.4 % MT LIQD: 1.0000 | OROMUCOSAL | Status: DC | PRN

## 2024-02-09 MED ORDER — SODIUM CHLORIDE (PF) 0.9 % IJ SOLN
INTRAMUSCULAR | Status: DC | PRN
  Administered 2024-02-09: 61 mL

## 2024-02-09 MED ORDER — ORAL CARE MOUTH RINSE: 15.0000 mL | Freq: Once | OROMUCOSAL | Status: AC

## 2024-02-09 MED ORDER — TRANEXAMIC ACID-NACL 1000-0.7 MG/100ML-% IV SOLN
1000.0000 mg | INTRAVENOUS | Status: AC
  Administered 2024-02-09: 1000 mg via INTRAVENOUS
  Filled 2024-02-09: qty 100

## 2024-02-09 MED ORDER — ROPIVACAINE HCL 5 MG/ML IJ SOLN
INTRAMUSCULAR | Status: DC | PRN
  Administered 2024-02-09: 30 mL via PERINEURAL

## 2024-02-09 MED ORDER — ALUM & MAG HYDROXIDE-SIMETH 200-200-20 MG/5ML PO SUSP: 30.0000 mL | ORAL | Status: DC | PRN

## 2024-02-09 NOTE — Anesthesia Procedure Notes (Signed)
 Spinal  Patient location during procedure: OR Start time: 02/09/2024 10:42 AM End time: 02/09/2024 10:44 AM Reason for block: surgical anesthesia  Staffing Performed: anesthesiologist  Authorized by: Niels Marien CROME, MD   Performed by: Niels Marien CROME, MD  Preanesthetic Checklist Completed: patient identified, IV checked, risks and benefits discussed, surgical consent, monitors and equipment checked, pre-op evaluation and timeout performed Spinal Block Patient position: sitting Prep: DuraPrep and site prepped and draped Patient monitoring: cardiac monitor, continuous pulse ox and blood pressure Approach: midline Location: L3-4 Injection technique: single-shot Needle Needle type: Pencan  Needle gauge: 24 G Needle length: 9 cm Assessment Sensory level: T6 Events: CSF return  Additional Notes Functioning IV was confirmed and monitors were applied. Sterile prep and drape, including hand hygiene and sterile gloves were used. The patient was positioned and the spine was prepped. The skin was anesthetized with lidocaine .  Free flow of clear CSF was obtained prior to injecting local anesthetic into the CSF.  The spinal needle aspirated freely following injection.  The needle was carefully withdrawn.  The patient tolerated the procedure well.

## 2024-02-09 NOTE — Interval H&P Note (Signed)
 History and Physical Interval Note:  02/09/2024 9:40 AM  Steven Holloway  has presented today for surgery, with the diagnosis of Left knee osteoarthritis.  The various methods of treatment have been discussed with the patient and family. After consideration of risks, benefits and other options for treatment, the patient has consented to  Procedures: ARTHROPLASTY, KNEE, TOTAL, USING IMAGELESS COMPUTER-ASSISTED NAVIGATION (Left) as a surgical intervention.  The patient's history has been reviewed, patient examined, no change in status, stable for surgery.  I have reviewed the patient's chart and labs.  Questions were answered to the patient's satisfaction.    The risks, benefits, and alternatives were discussed with the patient. There are risks associated with the surgery including, but not limited to, problems with anesthesia (death), infection, instability (giving out of the joint), dislocation, differences in leg length/angulation/rotation, fracture of bones, loosening or failure of implants, hematoma (blood accumulation) which may require surgical drainage, blood clots, pulmonary embolism, nerve injury (foot drop), and blood vessel injury. The patient understands these risks and elects to proceed.    Redell PARAS Janelis Stelzer

## 2024-02-09 NOTE — Anesthesia Procedure Notes (Addendum)
 Procedure Name: MAC Date/Time: 02/09/2024 10:48 AM  Performed by: Nada Corean CROME, CRNAPre-anesthesia Checklist: Patient identified, Emergency Drugs available, Suction available, Patient being monitored and Timeout performed Patient Re-evaluated:Patient Re-evaluated prior to induction Oxygen Delivery Method: Simple face mask Preoxygenation: Pre-oxygenation with 100% oxygen Induction Type: IV induction Ventilation: Nasal airway inserted- appropriate to patient size and Oral airway inserted - appropriate to patient size Placement Confirmation: positive ETCO2 Dental Injury: Teeth and Oropharynx as per pre-operative assessment

## 2024-02-09 NOTE — Op Note (Signed)
 OPERATIVE REPORT  SURGEON: Redell Shoals, MD   ASSISTANT: Valery Potters, PA-C  PREOPERATIVE DIAGNOSIS: Primary Left knee arthritis.   POSTOPERATIVE DIAGNOSIS: Primary Left knee arthritis.   PROCEDURE: Computer assisted Left total knee arthroplasty.   IMPLANTS: Zimmer Persona PPS Cementless CR femur, size 11 Narrow. Persona 0 degree Spiked Keel OsseoTi Tibia, size E. Vivacit-E polyethelyene insert, size 10 mm, CR. OsseoTi 3-Peg patella, size 38 mm.  ANESTHESIA:  MAC, Regional, and Spinal  TOURNIQUET TIME: Not utilized.   ESTIMATED BLOOD LOSS:-300 mL    ANTIBIOTICS: 2g Ancef .  DRAINS: None.  COMPLICATIONS: None   CONDITION: PACU - hemodynamically stable.   BRIEF CLINICAL NOTE: Steven Holloway is a 79 y.o. male with a long-standing history of Left knee arthritis. After failing conservative management, the patient was indicated for total knee arthroplasty. The risks, benefits, and alternatives to the procedure were explained, and the patient elected to proceed.  PROCEDURE IN DETAIL: Adductor canal block was obtained in the pre-op holding area. Once inside the operative room, spinal anesthesia was obtained, and a foley catheter was inserted. The patient was then positioned and the lower extremity was prepped and draped in the normal sterile surgical fashion.  A time-out was called verifying side and site of surgery. The patient received IV antibiotics within 60 minutes of beginning the procedure. A tourniquet was not utilized.   An anterior approach to the knee was performed utilizing a midvastus arthrotomy. A medial release was performed and the patellar fat pad was excised. Stryker imageless navigation was used to cut the distal femur perpendicular to the mechanical axis. A freehand patellar resection was performed, and the patella was sized and prepared with 3 lug holes.  Nagivation was used to make a neutral proximal tibia resection, taking 10 mm of bone from the less affected  lateral side with 3 degrees of slope. The menisci were excised. A spacer block was placed, and the alignment and balance in extension were confirmed.   The distal femur was sized using the 3-degree external rotation guide referencing the posterior femoral cortex. The appropriate 4-in-1 cutting block was pinned into place. Rotation was checked using Whiteside's line, the epicondylar axis, and then confirmed with a spacer block in flexion. The remaining femoral cuts were performed, taking care to protect the MCL.  The tibia was sized and the trial tray was pinned into place. The remaining trail components were inserted. The knee was stable to varus and valgus stress through a full range of motion. The patella tracked centrally, and the PCL was well balanced. The trial components were removed, and the proximal tibial surface was prepared. Final components were impacted into place. The knee was tested for a final time and found to be well balanced.   The wound was copiously irrigated with Prontosan solution and normal saline using pulse lavage.  Marcaine  solution was injected into the periarticular soft tissue.  The wound was closed in layers using #1 Vicryl and Stratafix for the fascia, 2-0 Vicryl for the subcutaneous fat, 2-0 Monocryl for the deep dermal layer, 3-0 running Monocryl subcuticular Stitch, and 4-0 Monocryl stay sutures at both ends of the wound. Dermabond was applied to the skin.  Once the glue was fully dried, an Aquacell Ag and compressive dressing were applied.  The patient was transported to the recovery room in stable condition.  Sponge, needle, and instrument counts were correct at the end of the case x2.  The patient tolerated the procedure well and there were no known  complications.  Please note that a surgical assistant was a medical necessity for this procedure in order to perform it in a safe and expeditious manner. Surgical assistant was necessary to retract the ligaments and vital  neurovascular structures to prevent injury to them and also necessary for proper positioning of the limb to allow for anatomic placement of the prosthesis.

## 2024-02-09 NOTE — Evaluation (Signed)
 Physical Therapy Evaluation Patient Details Name: Steven Holloway MRN: [id number] DOB: April 12, 1945 Today's Date: 02/09/2024  History of Present Illness  Pt s/p L TKR and with hx of DDD, macular degeneration and back surgery  Clinical Impression  Pt s/p L TKR and presents with decreased L LE strength/ROM and post op pain limiting functional mobility. Pt motivated and should progress to dc home with family assist.      If plan is discharge home, recommend the following: A little help with walking and/or transfers   Can travel by private vehicle        Equipment Recommendations Rolling walker (2 wheels)  Recommendations for Other Services       Functional Status Assessment Patient has had a recent decline in their functional status and demonstrates the ability to make significant improvements in function in a reasonable and predictable amount of time.     Precautions / Restrictions Precautions Precautions: Fall;Knee Restrictions Weight Bearing Restrictions Per Provider Order: Yes Other Position/Activity Restrictions: WBAT      Mobility  Bed Mobility Overal bed mobility: Needs Assistance Bed Mobility: Supine to Sit     Supine to sit: Contact guard     General bed mobility comments: for safety    Transfers Overall transfer level: Needs assistance Equipment used: Rolling walker (2 wheels) Transfers: Sit to/from Stand, Bed to chair/wheelchair/BSC Sit to Stand: Min assist   Step pivot transfers: Min assist, Mod assist       General transfer comment: cues for LE management and use of UEs to self assist; step pvt bed to chair    Ambulation/Gait               General Gait Details: step pvt to chair only 2* residual nerve block and poor control of L LE with WB  Stairs            Wheelchair Mobility     Tilt Bed    Modified Rankin (Stroke Patients Only)       Balance                                             Pertinent  Vitals/Pain Pain Assessment Pain Assessment: 0-10 Pain Score: 2  Pain Location: L knee Pain Descriptors / Indicators: Aching, Sore Pain Intervention(s): Limited activity within patient's tolerance, Monitored during session, Premedicated before session, Ice applied    Home Living Family/patient expects to be discharged to:: Private residence Living Arrangements: Spouse/significant other Available Help at Discharge: Family Type of Home: House Home Access: Stairs to enter Entrance Stairs-Rails: Left Entrance Stairs-Number of Steps: 2   Home Layout: One level Home Equipment: Cane - single point      Prior Function Prior Level of Function : Independent/Modified Independent             Mobility Comments: Using cane as needed       Extremity/Trunk Assessment   Upper Extremity Assessment Upper Extremity Assessment: Overall WFL for tasks assessed    Lower Extremity Assessment Lower Extremity Assessment: LLE deficits/detail LLE Deficits / Details: Buckling noted with attempt to WB - residual block?    Cervical / Trunk Assessment Cervical / Trunk Assessment: Normal  Communication   Communication Communication: No apparent difficulties    Cognition Arousal: Alert Behavior During Therapy: WFL for tasks assessed/performed   PT - Cognitive impairments: No apparent impairments  Following commands: Intact       Cueing Cueing Techniques: Verbal cues     General Comments      Exercises     Assessment/Plan    PT Assessment Patient needs continued PT services  PT Problem List Decreased strength;Decreased range of motion;Decreased activity tolerance;Decreased balance;Decreased mobility;Decreased knowledge of use of DME;Pain       PT Treatment Interventions DME instruction;Gait training;Stair training;Functional mobility training;Therapeutic activities;Balance training;Therapeutic exercise;Patient/family education    PT Goals  (Current goals can be found in the Care Plan section)  Acute Rehab PT Goals Patient Stated Goal: Regain IND PT Goal Formulation: With patient Time For Goal Achievement: 02/16/24 Potential to Achieve Goals: Good    Frequency 7X/week     Co-evaluation               AM-PAC PT 6 Clicks Mobility  Outcome Measure Help needed turning from your back to your side while in a flat bed without using bedrails?: A Little Help needed moving from lying on your back to sitting on the side of a flat bed without using bedrails?: A Little Help needed moving to and from a bed to a chair (including a wheelchair)?: A Little Help needed standing up from a chair using your arms (e.g., wheelchair or bedside chair)?: A Little Help needed to walk in hospital room?: Total Help needed climbing 3-5 steps with a railing? : Total 6 Click Score: 14    End of Session Equipment Utilized During Treatment: Gait belt Activity Tolerance: Patient tolerated treatment well Patient left: in chair;with call bell/phone within reach;with family/visitor present Nurse Communication: Mobility status PT Visit Diagnosis: Difficulty in walking, not elsewhere classified (R26.2);Unsteadiness on feet (R26.81)    Time: 8445-8383 PT Time Calculation (min) (ACUTE ONLY): 22 min   Charges:   PT Evaluation $PT Eval Low Complexity: 1 Low   PT General Charges $$ ACUTE PT VISIT: 1 Visit         [facility] PT Acute Rehabilitation Services Office [phone]   Renita Brocks 02/09/2024, 4:33 PM

## 2024-02-09 NOTE — Transfer of Care (Signed)
 Immediate Anesthesia Transfer of Care Note  Patient: Steven Holloway  Procedure(s) Performed: ARTHROPLASTY, KNEE, TOTAL, USING IMAGELESS COMPUTER-ASSISTED NAVIGATION (Left: Knee)  Patient Location: PACU  Anesthesia Type:MAC, Regional, and Spinal  Level of Consciousness: awake, alert , oriented, and patient cooperative  Airway & Oxygen Therapy: Patient Spontanous Breathing and Patient connected to face mask oxygen  Post-op Assessment: Report given to RN and Post -op Vital signs reviewed and stable  Post vital signs: Reviewed and stable  Last Vitals:  Vitals Value Taken Time  BP 84/58   Temp    Pulse 80 02/09/24 13:24  Resp 21 02/09/24 13:24  SpO2 92 % 02/09/24 13:24  Vitals shown include unfiled device data.  Last Pain:  Vitals:   02/09/24 1000  TempSrc:   PainSc: 0-No pain         Complications: No notable events documented.

## 2024-02-09 NOTE — Anesthesia Procedure Notes (Signed)
 Anesthesia Regional Block: Adductor canal block   Pre-Anesthetic Checklist: , timeout performed,  Correct Patient, Correct Site, Correct Laterality,  Correct Procedure, Correct Position, site marked,  Risks and benefits discussed,  Pre-op evaluation,  At surgeon's request and post-op pain management  Laterality: Left  Prep: Maximum Sterile Barrier Precautions used, chloraprep       Needles:  Injection technique: Single-shot  Needle Type: Echogenic Stimulator Needle     Needle Length: 9cm  Needle Gauge: 21     Additional Needles:   Procedures:,,,, ultrasound used (permanent image in chart),,    Narrative:  Start time: 02/09/2024 9:40 AM End time: 02/09/2024 9:43 AM Injection made incrementally with aspirations every 5 mL. Anesthesiologist: Niels Marien CROME, MD

## 2024-02-10 ENCOUNTER — Other Ambulatory Visit (HOSPITAL_COMMUNITY): Payer: Self-pay

## 2024-02-10 ENCOUNTER — Encounter (HOSPITAL_COMMUNITY): Payer: Self-pay | Admitting: Orthopedic Surgery

## 2024-02-10 DIAGNOSIS — M1712 Unilateral primary osteoarthritis, left knee: Secondary | ICD-10-CM | POA: Diagnosis not present

## 2024-02-10 LAB — BASIC METABOLIC PANEL WITH GFR
Anion gap: 8 (ref 5–15)
BUN: 14 mg/dL (ref 8–23)
CO2: 26 mmol/L (ref 22–32)
Calcium: 8.9 mg/dL (ref 8.9–10.3)
Chloride: 105 mmol/L (ref 98–111)
Creatinine, Ser: 1.24 mg/dL (ref 0.61–1.24)
GFR, Estimated: 59 mL/min — ABNORMAL LOW
Glucose, Bld: 157 mg/dL — ABNORMAL HIGH (ref 70–99)
Potassium: 4.4 mmol/L (ref 3.5–5.1)
Sodium: 139 mmol/L (ref 135–145)

## 2024-02-10 LAB — CBC
HCT: 35.6 % — ABNORMAL LOW (ref 39.0–52.0)
Hemoglobin: 12.2 g/dL — ABNORMAL LOW (ref 13.0–17.0)
MCH: 29.3 pg (ref 26.0–34.0)
MCHC: 34.3 g/dL (ref 30.0–36.0)
MCV: 85.6 fL (ref 80.0–100.0)
Platelets: 133 K/uL — ABNORMAL LOW (ref 150–400)
RBC: 4.16 MIL/uL — ABNORMAL LOW (ref 4.22–5.81)
RDW: 12.1 % (ref 11.5–15.5)
WBC: 11.9 K/uL — ABNORMAL HIGH (ref 4.0–10.5)
nRBC: 0 % (ref 0.0–0.2)

## 2024-02-10 MED ORDER — OXYCODONE HCL 5 MG PO TABS
5.0000 mg | ORAL_TABLET | ORAL | 0 refills | Status: AC | PRN
  Filled 2024-02-10: qty 40, 7d supply, fill #0

## 2024-02-10 MED ORDER — DOCUSATE SODIUM 100 MG PO CAPS
100.0000 mg | ORAL_CAPSULE | Freq: Two times a day (BID) | ORAL | 0 refills | Status: AC
  Filled 2024-02-10: qty 60, 30d supply, fill #0

## 2024-02-10 MED ORDER — SENNA 8.6 MG PO TABS
2.0000 | ORAL_TABLET | Freq: Every day | ORAL | 0 refills | Status: AC
  Filled 2024-02-10: qty 30, 15d supply, fill #0

## 2024-02-10 MED ORDER — ORAL CARE MOUTH RINSE: 15.0000 mL | OROMUCOSAL | Status: DC | PRN

## 2024-02-10 MED ORDER — POLYETHYLENE GLYCOL 3350 17 GM/SCOOP PO POWD
17.0000 g | Freq: Every day | ORAL | 0 refills | Status: AC | PRN
  Filled 2024-02-10: qty 238, 14d supply, fill #0

## 2024-02-10 MED ORDER — ASPIRIN 81 MG PO CHEW
81.0000 mg | CHEWABLE_TABLET | Freq: Two times a day (BID) | ORAL | 0 refills | Status: AC
  Filled 2024-02-10: qty 90, 45d supply, fill #0

## 2024-02-10 NOTE — Progress Notes (Signed)
 Physical Therapy Treatment Patient Details Name: Steven Holloway MRN: [id number] DOB: 1945-07-09 Today's Date: 02/10/2024   History of Present Illness Pt s/p L TKR and with hx of DDD, macular degeneration and back surgery    PT Comments  Pt continues to progress well with mobility including up to hall to ambulate, negotiated stairs and reviewed written HEP.  Pt eager for dc home this am.    If plan is discharge home, recommend the following: A little help with walking and/or transfers   Can travel by private vehicle        Equipment Recommendations  Rolling walker (2 wheels)    Recommendations for Other Services       Precautions / Restrictions Precautions Precautions: Fall;Knee Restrictions Weight Bearing Restrictions Per Provider Order: Yes LLE Weight Bearing Per Provider Order: Weight bearing as tolerated Other Position/Activity Restrictions: WBAT     Mobility  Bed Mobility Overal bed mobility: Needs Assistance Bed Mobility: Supine to Sit, Sit to Supine     Supine to sit: Supervision Sit to supine: Supervision   General bed mobility comments: Pt up in chair and returns to same    Transfers Overall transfer level: Needs assistance Equipment used: Rolling walker (2 wheels) Transfers: Sit to/from Stand Sit to Stand: Contact guard assist, Supervision           General transfer comment: cues for LE management and use of UEs to self assist    Ambulation/Gait Ambulation/Gait assistance: Contact guard assist, Supervision Gait Distance (Feet): 100 Feet Assistive device: Rolling walker (2 wheels) Gait Pattern/deviations: Step-to pattern, Step-through pattern, Decreased step length - right, Decreased step length - left, Shuffle, Trunk flexed       General Gait Details: min cues for posturre and position from [employer] Stairs: Yes Stairs assistance: Min assist Stair Management: One rail Left, Step to pattern, Forwards, With cane Number of Stairs:  5 General stair comments: 2+3 stairs with can/rail and cues for sequence   Wheelchair Mobility     Tilt Bed    Modified Rankin (Stroke Patients Only)       Balance Overall balance assessment: Needs assistance Sitting-balance support: No upper extremity supported, Feet supported Sitting balance-Leahy Scale: Good     Standing balance support: No upper extremity supported Standing balance-Leahy Scale: Fair                              Hotel Manager: No apparent difficulties  Cognition Arousal: Alert Behavior During Therapy: WFL for tasks assessed/performed   PT - Cognitive impairments: No apparent impairments                         Following commands: Intact      Cueing Cueing Techniques: Verbal cues  Exercises Total Joint Exercises Ankle Circles/Pumps: AROM, Both, 15 reps, Supine Quad Sets: AROM, Both, 10 reps, Supine Heel Slides: AAROM, Left, 15 reps, Supine Straight Leg Raises: AAROM, AROM, Left, 15 reps, Supine Long Arc Quad: AAROM, Left, 10 reps, Seated    General Comments        Pertinent Vitals/Pain Pain Assessment Pain Assessment: 0-10 Pain Score: 3  Pain Location: L knee Pain Descriptors / Indicators: Aching, Sore Pain Intervention(s): Limited activity within patient's tolerance, Monitored during session, Ice applied    Home Living  Prior Function            PT Goals (current goals can now be found in the care plan section) Acute Rehab PT Goals Patient Stated Goal: Regain IND PT Goal Formulation: With patient Time For Goal Achievement: 02/16/24 Potential to Achieve Goals: Good Progress towards PT goals: Progressing toward goals    Frequency    7X/week      PT Plan      Co-evaluation              AM-PAC PT 6 Clicks Mobility   Outcome Measure  Help needed turning from your back to your side while in a flat bed without using bedrails?:  A Little Help needed moving from lying on your back to sitting on the side of a flat bed without using bedrails?: A Little Help needed moving to and from a bed to a chair (including a wheelchair)?: A Little Help needed standing up from a chair using your arms (e.g., wheelchair or bedside chair)?: A Little Help needed to walk in hospital room?: A Little Help needed climbing 3-5 steps with a railing? : A Little 6 Click Score: 18    End of Session Equipment Utilized During Treatment: Gait belt Activity Tolerance: Patient tolerated treatment well Patient left: in chair;with call bell/phone within reach;with family/visitor present Nurse Communication: Mobility status PT Visit Diagnosis: Difficulty in walking, not elsewhere classified (R26.2);Unsteadiness on feet (R26.81)     Time: 9074-9061 PT Time Calculation (min) (ACUTE ONLY): 13 min  Charges:    $Gait Training: 8-22 mins $Therapeutic Exercise: 8-22 mins PT General Charges $$ ACUTE PT VISIT: 1 Visit                     [facility] PT Acute Rehabilitation Services Office [phone]    Freddi Schrager 02/10/2024, 1:45 PM

## 2024-02-10 NOTE — Progress Notes (Signed)
 Physical Therapy Treatment Patient Details Name: Steven Holloway MRN: [id number] DOB: 1945/02/18 Today's Date: 02/10/2024   History of Present Illness Pt s/p L TKR and with hx of DDD, macular degeneration and back surgery    PT Comments  Pt motivated and progressing well with mobility with noted improvement in stability with gait and activity tolerance.  Pt up to ambulate in hall and HEP initiated.  Pt hopeful for dc home this date.  Will follow up with stair training prior to dc.    If plan is discharge home, recommend the following: A little help with walking and/or transfers   Can travel by private vehicle        Equipment Recommendations  Rolling walker (2 wheels)    Recommendations for Other Services       Precautions / Restrictions Precautions Precautions: Fall;Knee Restrictions Weight Bearing Restrictions Per Provider Order: Yes LLE Weight Bearing Per Provider Order: Weight bearing as tolerated Other Position/Activity Restrictions: WBAT     Mobility  Bed Mobility Overal bed mobility: Needs Assistance Bed Mobility: Supine to Sit, Sit to Supine     Supine to sit: Supervision Sit to supine: Supervision   General bed mobility comments: for safety    Transfers Overall transfer level: Needs assistance Equipment used: Rolling walker (2 wheels) Transfers: Sit to/from Stand Sit to Stand: Contact guard assist           General transfer comment: Steady assist with cues for LE management and use of UEs to self assist    Ambulation/Gait Ambulation/Gait assistance: Contact guard assist, Supervision Gait Distance (Feet): 140 Feet Assistive device: Rolling walker (2 wheels) Gait Pattern/deviations: Step-to pattern, Step-through pattern, Decreased step length - right, Decreased step length - left, Shuffle, Trunk flexed       General Gait Details: Marked improvement in stability with gait; cues for posture, position from RW and intial sequence   Stairs              Wheelchair Mobility     Tilt Bed    Modified Rankin (Stroke Patients Only)       Balance Overall balance assessment: Needs assistance Sitting-balance support: No upper extremity supported, Feet supported Sitting balance-Leahy Scale: Good     Standing balance support: Single extremity supported Standing balance-Leahy Scale: Poor                              Communication Communication Communication: No apparent difficulties  Cognition Arousal: Alert Behavior During Therapy: WFL for tasks assessed/performed   PT - Cognitive impairments: No apparent impairments                         Following commands: Intact      Cueing Cueing Techniques: Verbal cues  Exercises Total Joint Exercises Ankle Circles/Pumps: AROM, Both, 15 reps, Supine Quad Sets: AROM, Both, 10 reps, Supine Heel Slides: AAROM, Left, 15 reps, Supine Straight Leg Raises: AAROM, AROM, Left, 15 reps, Supine Long Arc Quad: AAROM, Left, 10 reps, Seated    General Comments        Pertinent Vitals/Pain Pain Assessment Pain Assessment: 0-10 Pain Score: 4  Pain Location: L knee Pain Descriptors / Indicators: Aching, Sore Pain Intervention(s): Limited activity within patient's tolerance, Monitored during session, Ice applied (pt declines premed)    Home Living  Prior Function            PT Goals (current goals can now be found in the care plan section) Acute Rehab PT Goals Patient Stated Goal: Regain IND PT Goal Formulation: With patient Time For Goal Achievement: 02/16/24 Potential to Achieve Goals: Good Progress towards PT goals: Progressing toward goals    Frequency    7X/week      PT Plan      Co-evaluation              AM-PAC PT 6 Clicks Mobility   Outcome Measure  Help needed turning from your back to your side while in a flat bed without using bedrails?: A Little Help needed moving from lying on your  back to sitting on the side of a flat bed without using bedrails?: A Little Help needed moving to and from a bed to a chair (including a wheelchair)?: A Little Help needed standing up from a chair using your arms (e.g., wheelchair or bedside chair)?: A Little Help needed to walk in hospital room?: A Little Help needed climbing 3-5 steps with a railing? : A Little 6 Click Score: 18    End of Session Equipment Utilized During Treatment: Gait belt Activity Tolerance: Patient tolerated treatment well Patient left: in chair;with call bell/phone within reach;with family/visitor present Nurse Communication: Mobility status PT Visit Diagnosis: Difficulty in walking, not elsewhere classified (R26.2);Unsteadiness on feet (R26.81)     Time: 9182-9159 PT Time Calculation (min) (ACUTE ONLY): 23 min  Charges:    $Gait Training: 8-22 mins $Therapeutic Exercise: 8-22 mins PT General Charges $$ ACUTE PT VISIT: 1 Visit                     [facility] PT Acute Rehabilitation Services Office [phone]    Steven Holloway 02/10/2024, 1:41 PM

## 2024-02-10 NOTE — Plan of Care (Signed)
" °  Problem: Education: Goal: Knowledge of the prescribed therapeutic regimen will improve Outcome: Progressing   Problem: Bowel/Gastric: Goal: Gastrointestinal status for postoperative course will improve Outcome: Progressing   Problem: Cardiac: Goal: Ability to maintain an adequate cardiac output Outcome: Progressing Goal: Will show no evidence of cardiac arrhythmias Outcome: Progressing   Problem: Nutritional: Goal: Will attain and maintain optimal nutritional status Outcome: Progressing   Problem: Neurological: Goal: Will regain or maintain usual level of consciousness Outcome: Progressing   Problem: Clinical Measurements: Goal: Ability to maintain clinical measurements within normal limits Outcome: Progressing Goal: Postoperative complications will be avoided or minimized Outcome: Progressing   Problem: Respiratory: Goal: Will regain and/or maintain adequate ventilation Outcome: Progressing Goal: Respiratory status will improve Outcome: Progressing   Problem: Skin Integrity: Goal: Demonstrates signs of wound healing without infection Outcome: Progressing   Problem: Urinary Elimination: Goal: Will remain free from infection Outcome: Progressing Goal: Ability to achieve and maintain adequate urine output Outcome: Progressing   Problem: Education: Goal: Knowledge of General Education information will improve Description: Including pain rating scale, medication(s)/side effects and non-pharmacologic comfort measures Outcome: Progressing   Problem: Health Behavior/Discharge Planning: Goal: Ability to manage health-related needs will improve Outcome: Progressing   Problem: Clinical Measurements: Goal: Ability to maintain clinical measurements within normal limits will improve Outcome: Progressing Goal: Will remain free from infection Outcome: Progressing Goal: Diagnostic test results will improve Outcome: Progressing Goal: Respiratory complications will  improve Outcome: Progressing Goal: Cardiovascular complication will be avoided Outcome: Progressing   Problem: Activity: Goal: Risk for activity intolerance will decrease Outcome: Progressing   Problem: Nutrition: Goal: Adequate nutrition will be maintained Outcome: Progressing   Problem: Coping: Goal: Level of anxiety will decrease Outcome: Progressing   Problem: Elimination: Goal: Will not experience complications related to bowel motility Outcome: Progressing Goal: Will not experience complications related to urinary retention Outcome: Progressing   Problem: Pain Managment: Goal: General experience of comfort will improve and/or be controlled Outcome: Progressing   Problem: Safety: Goal: Ability to remain free from injury will improve Outcome: Progressing   Problem: Skin Integrity: Goal: Risk for impaired skin integrity will decrease Outcome: Progressing   Problem: Education: Goal: Knowledge of the prescribed therapeutic regimen will improve Outcome: Progressing Goal: Individualized Educational Video(s) Outcome: Progressing   Problem: Activity: Goal: Ability to avoid complications of mobility impairment will improve Outcome: Progressing Goal: Range of joint motion will improve Outcome: Progressing   Problem: Clinical Measurements: Goal: Postoperative complications will be avoided or minimized Outcome: Progressing   Problem: Pain Management: Goal: Pain level will decrease with appropriate interventions Outcome: Progressing   Problem: Skin Integrity: Goal: Will show signs of wound healing Outcome: Progressing   "

## 2024-02-10 NOTE — Discharge Summary (Signed)
 " Physician Discharge Summary  Patient ID: Steven Holloway MRN: [id number] DOB/AGE: Apr 08, 1945 79 y.o.  Admit date: 02/09/2024 Discharge date: 02/10/2024  Admission Diagnoses:  Osteoarthritis of left knee  Discharge Diagnoses:  Principal Problem:   Osteoarthritis of left knee Active Problems:   S/P total knee arthroplasty, left   Past Medical History:  Diagnosis Date   Allergic rhinitis    Anxiety    on meds   Arthritis    generalized   Cataract    BILATERAL   Colon polyps    DDD (degenerative disc disease), lumbar    Difficult intubation    pt. denies   Fatigue    GERD (gastroesophageal reflux disease)    on meds   Heart murmur    Echo 2025   Hematuria    History of kidney stones    History of recurrent UTIs    Hx of adenomatous colonic polyps 05/27/2014   Hyperlipidemia    on meds   Hypertension    on meds   IFG (impaired fasting glucose)    Joint disease    Macular degeneration of both eyes    Pneumonia    PSA (psoriatic arthritis) (HCC)    Seasonal allergies    Tubular adenoma     Surgeries: Procedures: ARTHROPLASTY, KNEE, TOTAL, USING IMAGELESS COMPUTER-ASSISTED NAVIGATION on 02/09/2024   Consultants (if any):   Discharged Condition: Improved  Hospital Course: Steven Holloway is an 79 y.o. male who was admitted 02/09/2024 with a diagnosis of Osteoarthritis of left knee and went to the operating room on 02/09/2024 and underwent the above named procedures.    He was given perioperative antibiotics:  Anti-infectives (From admission, onward)    Start     Dose/Rate Route Frequency Ordered Stop   02/09/24 1700  ceFAZolin  (ANCEF ) IVPB 2g/100 mL premix        2 g 200 mL/hr over 30 Minutes Intravenous Every 6 hours 02/09/24 1533 02/10/24 0700   02/09/24 0845  ceFAZolin  (ANCEF ) IVPB 2g/100 mL premix        2 g 200 mL/hr over 30 Minutes Intravenous On call to O.R. 02/09/24 0842 02/09/24 1119       He was given sequential compression devices, early  ambulation, and aspirin  for DVT prophylaxis.  POD#1 Patient doing well. Ambulated well with PT. D/c home with OPPT. Follow-up in office in 2 weeks for repeat evaluation.   He benefited maximally from the hospital stay and there were no complications.    Recent vital signs:  Vitals:   02/10/24 0530 02/10/24 0949  BP: (!) 140/67 (!) 124/58  Pulse: 75 73  Resp: 18 16  Temp: (!) 97.5 F (36.4 C) (!) 97.5 F (36.4 C)  SpO2: 94% 100%    Recent laboratory studies:  Lab Results  Component Value Date   HGB 12.2 (L) 02/10/2024   HGB 14.3 02/01/2024   HGB 14.7 11/03/2019   Lab Results  Component Value Date   WBC 11.9 (H) 02/10/2024   PLT 133 (L) 02/10/2024   Lab Results  Component Value Date   INR 1.24 12/24/2017   Lab Results  Component Value Date   NA 139 02/10/2024   K 4.4 02/10/2024   CL 105 02/10/2024   CO2 26 02/10/2024   BUN 14 02/10/2024   CREATININE 1.24 02/10/2024   GLUCOSE 157 (H) 02/10/2024     Allergies as of 02/10/2024       Reactions   Sulfa Antibiotics Nausea And Vomiting  Sulfasalazine Nausea And Vomiting   Ciprofloxacin  Nausea And Vomiting   Other Other (See Comments)        Medication List     PAUSE taking these medications    aspirin  EC 81 MG tablet Wait to take this until your doctor or other care provider tells you to start again. Take 81 mg by mouth at bedtime. You also have another medication with the same name that you may need to continue taking.       TAKE these medications    Aspirin  Low Dose 81 MG chewable tablet Generic drug: aspirin  Chew 1 tablet (81 mg total) by mouth 2 (two) times daily with a meal. What changed: Another medication with the same name was paused. Ask your nurse or doctor if you should take this medication.   CRANBERRY PO Take 1,500 mg by mouth daily.   docusate sodium  100 MG capsule Commonly known as: Colace Take 1 capsule (100 mg total) by mouth 2 (two) times daily.   escitalopram  5 MG  tablet Commonly known as: LEXAPRO  Take 5 mg by mouth daily.   fluticasone  50 MCG/ACT nasal spray Commonly known as: FLONASE  Place 1 spray into both nostrils at bedtime.   metoprolol  succinate 25 MG 24 hr tablet Commonly known as: TOPROL -XL TAKE 1 TABLET BY MOUTH ONCE  DAILY   oxyCODONE  5 MG immediate release tablet Commonly known as: Roxicodone  Take 1 tablet (5 mg total) by mouth every 4 (four) hours as needed for severe pain (pain score 7-10).   polyethylene glycol powder 17 GM/SCOOP powder Commonly known as: GLYCOLAX /MIRALAX  Take 17 g by mouth daily as needed for mild constipation or moderate constipation. Dissolve 1 capful (17g) in 4-8 ounces of liquid and take by mouth daily.   potassium chloride  SA 20 MEQ tablet Commonly known as: KLOR-CON  M Take 20 mEq by mouth 2 (two) times daily.   rosuvastatin  20 MG tablet Commonly known as: CRESTOR  TAKE 1 TABLET BY MOUTH DAILY   senna 8.6 MG Tabs tablet Commonly known as: SENOKOT Take 2 tablets (17.2 mg total) by mouth at bedtime for 15 days.   tamsulosin  0.4 MG Caps capsule Commonly known as: FLOMAX  Take 0.4 mg by mouth daily.   telmisartan 40 MG tablet Commonly known as: MICARDIS Take 40 mg by mouth daily.               Discharge Care Instructions  (From admission, onward)           Start     Ordered   02/10/24 0000  Weight bearing as tolerated        02/10/24 0846   02/10/24 0000  Change dressing       Comments: Do not remove your dressing.   02/10/24 0846              WEIGHT BEARING   Weight bearing as tolerated with assist device (walker, cane, etc) as directed, use it as long as suggested by your surgeon or therapist, typically at least 4-6 weeks.   EXERCISES  Results after joint replacement surgery are often greatly improved when you follow the exercise, range of motion and muscle strengthening exercises prescribed by your doctor. Safety measures are also important to protect the joint from  further injury. Any time any of these exercises cause you to have increased pain or swelling, decrease what you are doing until you are comfortable again and then slowly increase them. If you have problems or questions, call your caregiver or physical  therapist for advice.   Rehabilitation is important following a joint replacement. After just a few days of immobilization, the muscles of the leg can become weakened and shrink (atrophy).  These exercises are designed to build up the tone and strength of the thigh and leg muscles and to improve motion. Often times heat used for twenty to thirty minutes before working out will loosen up your tissues and help with improving the range of motion but do not use heat for the first two weeks following surgery (sometimes heat can increase post-operative swelling).   These exercises can be done on a training (exercise) mat, on the floor, on a table or on a bed. Use whatever works the best and is most comfortable for you.    Use music or television while you are exercising so that the exercises are a pleasant break in your day. This will make your life better with the exercises acting as a break in your routine that you can look forward to.   Perform all exercises about fifteen times, three times per day or as directed.  You should exercise both the operative leg and the other leg as well.  Exercises include:   Quad Sets - Tighten up the muscle on the front of the thigh (Quad) and hold for 5-10 seconds.   Straight Leg Raises - With your knee straight (if you were given a brace, keep it on), lift the leg to 60 degrees, hold for 3 seconds, and slowly lower the leg.  Perform this exercise against resistance later as your leg gets stronger.  Leg Slides: Lying on your back, slowly slide your foot toward your buttocks, bending your knee up off the floor (only go as far as is comfortable). Then slowly slide your foot back down until your leg is flat on the floor again.   Angel Wings: Lying on your back spread your legs to the side as far apart as you can without causing discomfort.  Hamstring Strength:  Lying on your back, push your heel against the floor with your leg straight by tightening up the muscles of your buttocks.  Repeat, but this time bend your knee to a comfortable angle, and push your heel against the floor.  You may put a pillow under the heel to make it more comfortable if necessary.   A rehabilitation program following joint replacement surgery can speed recovery and prevent re-injury in the future due to weakened muscles. Contact your doctor or a physical therapist for more information on knee rehabilitation.    CONSTIPATION  Constipation is defined medically as fewer than three stools per week and severe constipation as less than one stool per week.  Even if you have a regular bowel pattern at home, your normal regimen is likely to be disrupted due to multiple reasons following surgery.  Combination of anesthesia, postoperative narcotics, change in appetite and fluid intake all can affect your bowels.   YOU MUST use at least one of the following options; they are listed in order of increasing strength to get the job done.  They are all available over the counter, and you may need to use some, POSSIBLY even all of these options:    Drink plenty of fluids (prune juice may be helpful) and high fiber foods Colace 100 mg by mouth twice a day  Senokot for constipation as directed and as needed Dulcolax (bisacodyl ), take with full glass of water  Miralax  (polyethylene glycol) once or twice a day as needed.  If you have tried all these things and are unable to have a bowel movement in the first 3-4 days after surgery call either your surgeon or your primary doctor.    If you experience loose stools or diarrhea, hold the medications until you stool forms back up.  If your symptoms do not get better within 1 week or if they get worse, check with your  doctor.  If you experience the worst abdominal pain ever or develop nausea or vomiting, please contact the office immediately for further recommendations for treatment.   ITCHING:  If you experience itching with your medications, try taking only a single pain pill, or even half a pain pill at a time.  You can also use Benadryl  over the counter for itching or also to help with sleep.   TED HOSE STOCKINGS:  Use stockings on both legs until for at least 2 weeks or as directed by physician office. They may be removed at night for sleeping.  MEDICATIONS:  See your medication summary on the After Visit Summary that nursing will review with you.  You may have some home medications which will be placed on hold until you complete the course of blood thinner medication.  It is important for you to complete the blood thinner medication as prescribed.  PRECAUTIONS:  If you experience chest pain or shortness of breath - call 911 immediately for transfer to the hospital emergency department.   If you develop a fever greater that 101 F, purulent drainage from wound, increased redness or drainage from wound, foul odor from the wound/dressing, or calf pain - CONTACT YOUR SURGEON.                                                   FOLLOW-UP APPOINTMENTS:  If you do not already have a post-op appointment, please call the office for an appointment to be seen by your surgeon.  Guidelines for how soon to be seen are listed in your After Visit Summary, but are typically between 1-4 weeks after surgery.  OTHER INSTRUCTIONS:   Knee Replacement:  Do not place pillow under knee, focus on keeping the knee straight while resting. CPM instructions: 0-90 degrees, 2 hours in the morning, 2 hours in the afternoon, and 2 hours in the evening. Place foam block, curve side up under heel at all times except when in CPM or when walking.  DO NOT modify, tear, cut, or change the foam block in any way.   MAKE SURE YOU:  Understand  these instructions.  Get help right away if you are not doing well or get worse.    Thank you for letting us  be a part of your medical care team.  It is a privilege we respect greatly.  We hope these instructions will help you stay on track for a fast and full recovery!   Diagnostic Studies: DG Knee Left Port Result Date: 02/09/2024 CLINICAL DATA:  Postop left knee arthroplasty EXAM: PORTABLE LEFT KNEE - 1-2 VIEW COMPARISON:  None Available. FINDINGS: Frontal and lateral views of the left knee are obtained. 3 component left knee arthroplasty is identified in the expected position without evidence of acute complication. No acute fractures. Postsurgical changes within the anterior soft tissues. IMPRESSION: 1. Unremarkable left knee arthroplasty. Electronically Signed   By: Ozell Daring M.D.   On:  02/09/2024 17:22    Disposition: Discharge disposition: 01-Home or Self Care       Discharge Instructions     Call MD / Call 911   Complete by: As directed    If you experience chest pain or shortness of breath, CALL 911 and be transported to the hospital emergency room.  If you develope a fever above 101 F, pus (white drainage) or increased drainage or redness at the wound, or calf pain, call your surgeon's office.   Change dressing   Complete by: As directed    Do not remove your dressing.   Constipation Prevention   Complete by: As directed    Drink plenty of fluids.  Prune juice may be helpful.  You may use a stool softener, such as Colace (over the counter) 100 mg twice a day.  Use MiraLax  (over the counter) for constipation as needed.   Diet - low sodium heart healthy   Complete by: As directed    Discharge instructions   Complete by: As directed    Elevate toes above nose. Use cryotherapy as needed for pain and swelling.   Do not put a pillow under the knee. Place it under the heel.   Complete by: As directed    Driving restrictions   Complete by: As directed    No driving for 6  weeks   Increase activity slowly as tolerated   Complete by: As directed    Lifting restrictions   Complete by: As directed    No lifting for 6 weeks   Post-operative opioid taper instructions:   Complete by: As directed    POST-OPERATIVE OPIOID TAPER INSTRUCTIONS: It is important to wean off of your opioid medication as soon as possible. If you do not need pain medication after your surgery it is ok to stop day one. Opioids include: Codeine, Hydrocodone (Norco, Vicodin), Oxycodone (Percocet, oxycontin ) and hydromorphone  amongst others.  Long term and even short term use of opiods can cause: Increased pain response Dependence Constipation Depression Respiratory depression And more.  Withdrawal symptoms can include Flu like symptoms Nausea, vomiting And more Techniques to manage these symptoms Hydrate well Eat regular healthy meals Stay active Use relaxation techniques(deep breathing, meditating, yoga) Do Not substitute Alcohol  to help with tapering If you have been on opioids for less than two weeks and do not have pain than it is ok to stop all together.  Plan to wean off of opioids This plan should start within one week post op of your joint replacement. Maintain the same interval or time between taking each dose and first decrease the dose.  Cut the total daily intake of opioids by one tablet each day Next start to increase the time between doses. The last dose that should be eliminated is the evening dose.      TED hose   Complete by: As directed    Use stockings (TED hose) for 2 weeks on both leg(s).  You may remove them at night for sleeping.   Weight bearing as tolerated   Complete by: As directed         Follow-up Information     Leigh Valery RAMAN, PA-C. Schedule an appointment as soon as possible for a visit in 2 week(s).   Specialty: Orthopedic Surgery Why: For suture removal, For wound re-check Contact information: [address]  [place]  [address] [phone]                  Signed: Valery  S Deaundra Kutzer 02/10/2024, 11:44 AM  "

## 2024-02-10 NOTE — Progress Notes (Signed)
" ° ° °  Subjective:  Patient reports pain as mild.  Denies N/V/CP/SOB/Abd pain. He reports only needing tylenol . Denies tingling or numbness in LE bilaterally.  Catheter removed this am, void pending.  Eager for d/c home.    Objective:   VITALS:   Vitals:   02/09/24 1843 02/09/24 2346 02/10/24 0231 02/10/24 0530  BP: (!) 154/78 (!) 150/73 (!) 142/68 (!) 140/67  Pulse: 79 84 76 75  Resp: 20 18 18 18   Temp: 97.6 F (36.4 C) (!) 97.5 F (36.4 C) 97.8 F (36.6 C) (!) 97.5 F (36.4 C)  TempSrc:  Oral Oral Oral  SpO2: 96% 96% 93% 94%  Weight:      Height:        NAD Neurologically intact ABD soft Neurovascular intact Sensation intact distally Intact pulses distally Dorsiflexion/Plantar flexion intact Incision: dressing C/D/I No cellulitis present Compartment soft   Lab Results  Component Value Date   WBC 11.9 (H) 02/10/2024   HGB 12.2 (L) 02/10/2024   HCT 35.6 (L) 02/10/2024   MCV 85.6 02/10/2024   PLT 133 (L) 02/10/2024   BMET    Component Value Date/Time   NA 139 02/10/2024 0324   K 4.4 02/10/2024 0324   CL 105 02/10/2024 0324   CO2 26 02/10/2024 0324   GLUCOSE 157 (H) 02/10/2024 0324   BUN 14 02/10/2024 0324   CREATININE 1.24 02/10/2024 0324   CALCIUM  8.9 02/10/2024 0324   GFRNONAA 59 (L) 02/10/2024 0324     Assessment/Plan: 1 Day Post-Op   Principal Problem:   Osteoarthritis of left knee Active Problems:   S/P total knee arthroplasty, left   WBAT with walker DVT ppx: Aspirin , SCDs, TEDS PO pain control PT/OT: Patient just finished PT session. Stair session pending.  Dispo:  - D/c home with OPPT once cleared with PT and voided.    Steven Holloway Potters 02/10/2024, 8:43 AM   [facility]  [place] [address], [address], [place], [place] [address] Phone: [phone] [email or web address] Facebook  [employer]       "

## 2024-02-10 NOTE — Progress Notes (Signed)
 1120- AVS gone over with pt and his wife. All questions answered. IV removed. Pt has meds and walker to go home with him.   1135- NT took pt to the exit via a wheelchair and pt D/C'd home with his wife

## 2024-02-10 NOTE — Anesthesia Postprocedure Evaluation (Signed)
"   Anesthesia Post Note  Patient: Steven Holloway  Procedure(s) Performed: ARTHROPLASTY, KNEE, TOTAL, USING IMAGELESS COMPUTER-ASSISTED NAVIGATION (Left: Knee)     Patient location during evaluation: PACU Anesthesia Type: Regional and Spinal Level of consciousness: oriented and awake and alert Pain management: pain level controlled Vital Signs Assessment: post-procedure vital signs reviewed and stable Respiratory status: spontaneous breathing, respiratory function stable and patient connected to nasal cannula oxygen Cardiovascular status: blood pressure returned to baseline and stable Postop Assessment: no headache, no backache and no apparent nausea or vomiting Anesthetic complications: no   No notable events documented.  Last Vitals:  Vitals:   02/10/24 0231 02/10/24 0530  BP: (!) 142/68 (!) 140/67  Pulse: 76 75  Resp: 18 18  Temp: 36.6 C (!) 36.4 C  SpO2: 93% 94%    Last Pain:  Vitals:   02/10/24 0800  TempSrc:   PainSc: 0-No pain                 Mishael Haran L Alaney Witter      "

## 2024-02-10 NOTE — Progress Notes (Signed)
 Foley removed at 0600 and Pt due to void by 12:30. Pt education given and Pt verbalizes understanding.

## 2024-02-10 NOTE — Progress Notes (Signed)
 Discharge meds in a secure bag delivered to patient by this RN

## 2024-02-10 NOTE — TOC Transition Note (Signed)
 Transition of Care [facility]) - Discharge Note   Patient Details  Name: Steven Holloway MRN: [id number] Date of Birth: 1946/01/21  Transition of [facility]) CM/SW Contact:  NORMAN ASPEN, LCSW Phone Number: 02/10/2024, 9:38 AM   Clinical Narrative:     Met with pt and confirming he has received RW to room via [employer].  OPPT already arranged with [facility].  No further IP CM needs.  Final next level of care: OP Rehab Barriers to Discharge: No Barriers Identified   Patient Goals and CMS Choice Patient states their goals for this hospitalization and ongoing recovery are:: return home          Discharge Placement                       Discharge Plan and Services Additional resources added to the After Visit Summary for                  DME Arranged: Walker rolling DME Agency: [employer]                  Social Drivers of Health (SDOH) Interventions SDOH Screenings   Food Insecurity: Patient Declined (02/09/2024)  Housing: Patient Declined (02/09/2024)  Transportation Needs: Patient Declined (02/09/2024)  Utilities: Patient Declined (02/09/2024)  Social Connections: Patient Declined (02/09/2024)  Tobacco Use: Medium Risk (02/09/2024)     Readmission Risk Interventions     No data to display

## 2024-02-14 ENCOUNTER — Ambulatory Visit: Payer: Self-pay | Admitting: Neurology

## 2024-02-14 NOTE — Procedures (Signed)
 "  [facility] POLYSOMNOGRAPHY  INTERPRETATION REPORT   STUDY DATE:  02/01/2024     PATIENT NAME:  Steven Holloway         DATE OF BIRTH:  [birth year]  PATIENT ID:  [id number]    TYPE OF STUDY:  RBD  ATTENDING PHYSICIAN: DEDRA GORES, MD REFERRED BY: Dr Janey SCORING TECHNICIAN: Delon Sprung, RPSGT   HISTORY:  This 79 year-old Male patient reports loud snoring and witnessed apneas, has been acting out dreams sometimes and now here to check for sleep apnea and for REM BD.   ADDITIONAL INFORMATION:  The Epworth Sleepiness Scale was endorsed at   11 /24 points (scores above or equal to 10 are suggestive of hypersomnolence).  Height: 67 in Weight: 219 lb (BMI 34) Neck Size: 18 in  MEDICATIONS: amLODIPine Besylate 10 mg Daily Patient not taking: Reported on 11/21/2023 Aspirin  81 mg Daily at bedtime Cranberry 1,500 mg Daily Escitalopram  Oxalate 5 mg Daily Fluticasone  Propionate 50 MCG/ACT 1 spray Each Nare Daily at bedtime Loratadine 10 MG 1 tablet Daily Metoprolol  Succinate 25 mg Oral Daily Montelukast Sodium 10 mg Daily Pantoprazole  Sodium 40 MG pantoprazole  40 mg tablet,delayed release Potassium Chloride  Crys ER 20 MEQ 20 mEq 2 times daily Pravastatin  Sodium 40 MG 1 tablet Daily Rosuvastatin  Calcium  20 MG TAKE 1 TABLET BY MOUTH DAILY Tamsulosin  HCl 0.4 mg Daily Valsartan-hydroCHLOROthiazide  320-12.5 MG 1 tablet Daily    TECHNICAL DESCRIPTION: A registered sleep technologist ( RPSGT)  was in attendance for the duration of the recording.  Data collection, scoring, video monitoring, and reporting were performed in compliance with the AASM Manual for the Scoring of Sleep and Associated Events; (Hypopnea is scored based on the criteria listed in Section VIII D. 1b in the AASM Manual V2.6 using a 4% oxygen desaturation rule or Hypopnea is scored based on the criteria listed in Section VIII D. 1a in the AASM Manual V2.6 using 3% oxygen desaturation and /or arousal rule).    SLEEP CONTINUITY AND SLEEP ARCHITECTURE:  Lights-out was at 21:42: and lights-on at  05:07:, with  7.4 hours of recording time. Total sleep time ( TST) was 296.0 minutes with a decreased sleep efficiency at 66.5%.   Sleep latency was normal at 19.5 minutes.  REM sleep latency was increased at 176.5 minutes. Of the total sleep time, the percentage of stage N1 sleep was 4.2%, stage N2 sleep was 86%, stage N3 sleep was 0.0%, and REM sleep was 9.3%. There were 2 Stage R periods observed on this study night, 35 awakenings (i.e. transitions to Stage W from any sleep stage), and 101 total stage transitions. Wake after sleep onset (WASO) time accounted for 129 minutes. BODY POSITION:  TST was divided  between the following sleep positions as follows: supine 02 minutes (1%), non-supine 294 minutes (99%); right 108 minutes (36%), left 185 minutes (63%), and prone 00 minutes (0%). Total supine REM sleep time was 00 minutes (0% of total REM sleep).  RESPIRATORY MONITORING:   Based on CMS criteria (using a 4% oxygen desaturation rule for scoring hypopneas), there were 12 apneas (12 obstructive; 0 central; 0 mixed), and 15 hypopneas. The Apnea index was 2.4/h. Hypopnea index was 3.2/h. The AHI ( apnea-hypopnea index) was 5.5/h overall (120.0 while in supine, 4.5/h in non-supine; 4.4/h REM, and an AHI of 5.6 in NREM).   OXIMETRY: Oxyhemoglobin Saturation Nadir during sleep was at  91% from a mean of 96%. Total sleep time (TST) in  hypoxemia (<  89%) was 0.0 minutes, or 0.0% of total sleep time.  LIMB MOVEMENTS: There were 122 periodic limb movements of sleep (24.7/hr), of which 6 (1.2/hr) were associated with an arousal. none were noted during REM sleep.  AROUSAL: There were 82 arousals in total.  Of these, 17 were identified as respiratory-related arousals (3 /h), 6 were PLM-related arousals (1 /h), and 59 were non-specific arousals (12 /h). EEG:  PSG EEG was of normal amplitude and frequency, with symmetric  manifestation of sleep stages. EKG: The electrocardiogram documented NSR.  The average heart rate during sleep was 54 bpm.  The heart rate during sleep varied between a minimum of 47 bpm and maximum of  71 bpm. AUDIO and VIDEO:   Snoring was classified as loud . IMPRESSION: 1) Very mild Obstructive Sleep Apnea was seen ( AHI 5.5/h)  . The apneas clustered in supine sleep , these were without oxygen desaturation. Loud snoring was recorded.     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 The patient experienced those while sleeping on his right side.  No REM sleep behavior was present as PLMs didn't extend into REM sleep - there were no vocalizations or dream enactment captured.  3)Total sleep time was reduced at 296.0 minutes.  Sleep efficiency was decreased at 66.5%.  Restless sleep was seen- Sleep fragmentation  was noted-  The majority of sleep arousals was  rated as spontaneous, unrelated to physiological triggers and most often caused by discomfort or pain.   RECOMMENDATIONS:  Restless sleep was the main impression. Pain related? If pain is the problem, address pain relief first.  The mild degree of apnea would not require CPAP treatment- The AHI can be reduced by avoiding supine sleep and control of snoring could be achieved by use of a dental device.  Consider sleeping with an elevated  head of bed to reduce apneas further.     Hadlee Burback,  MD PS : The patient reported that he slept longer than usual in the sleep laboratory but with less sleep quality overall .           "
